# Patient Record
Sex: Male | Born: 1985 | Race: White | Hispanic: No | Marital: Single | Smoking: Current every day smoker
Health system: Southern US, Community
[De-identification: ages and names within clinical notes are randomized; demographics above are authoritative.]

## PROBLEM LIST (undated history)

## (undated) DIAGNOSIS — F952 Tourette's disorder: Secondary | ICD-10-CM

## (undated) DIAGNOSIS — F419 Anxiety disorder, unspecified: Secondary | ICD-10-CM

## (undated) DIAGNOSIS — F1020 Alcohol dependence, uncomplicated: Secondary | ICD-10-CM

## (undated) DIAGNOSIS — R109 Unspecified abdominal pain: Secondary | ICD-10-CM

## (undated) DIAGNOSIS — B1920 Unspecified viral hepatitis C without hepatic coma: Secondary | ICD-10-CM

## (undated) DIAGNOSIS — G8929 Other chronic pain: Secondary | ICD-10-CM

## (undated) DIAGNOSIS — F32A Depression, unspecified: Secondary | ICD-10-CM

## (undated) DIAGNOSIS — F329 Major depressive disorder, single episode, unspecified: Secondary | ICD-10-CM

## (undated) DIAGNOSIS — F1120 Opioid dependence, uncomplicated: Secondary | ICD-10-CM

---
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
## 2010-05-09 ENCOUNTER — Emergency Department (INDEPENDENT_AMBULATORY_CARE_PROVIDER_SITE_OTHER): Payer: Self-pay

## 2010-05-09 ENCOUNTER — Emergency Department (HOSPITAL_BASED_OUTPATIENT_CLINIC_OR_DEPARTMENT_OTHER)
Admission: EM | Admit: 2010-05-09 | Discharge: 2010-05-09 | Disposition: A | Discharge status: Home / Self Care | Payer: Self-pay | Attending: Emergency Medicine | Admitting: Emergency Medicine

## 2010-05-09 DIAGNOSIS — F17200 Nicotine dependence, unspecified, uncomplicated: Secondary | ICD-10-CM | POA: Insufficient documentation

## 2010-05-09 DIAGNOSIS — R1013 Epigastric pain: Secondary | ICD-10-CM | POA: Insufficient documentation

## 2010-05-09 DIAGNOSIS — R112 Nausea with vomiting, unspecified: Secondary | ICD-10-CM

## 2010-05-09 LAB — DIFFERENTIAL
Basophils Absolute: 0 10*3/uL (ref 0.0–0.1)
Basophils Relative: 0 % (ref 0–1)
Monocytes Relative: 6 % (ref 3–12)
Neutro Abs: 9.3 10*3/uL — ABNORMAL HIGH (ref 1.7–7.7)
Neutrophils Relative %: 86 % — ABNORMAL HIGH (ref 43–77)

## 2010-05-09 LAB — COMPREHENSIVE METABOLIC PANEL
ALT: 35 U/L (ref 0–53)
AST: 24 U/L (ref 0–37)
CO2: 25 mEq/L (ref 19–32)
Chloride: 104 mEq/L (ref 96–112)
GFR calc Af Amer: >60 mL/min (ref >60)
GFR calc non Af Amer: >60 mL/min (ref >60)
Sodium: 144 mEq/L (ref 135–145)
Total Bilirubin: 0.6 mg/dL (ref 0.3–1.2)

## 2010-05-09 LAB — CBC
Hemoglobin: 15 g/dL (ref 13.0–17.0)
RBC: 5.01 MIL/uL (ref 4.22–5.81)

## 2010-11-25 ENCOUNTER — Encounter: Payer: Self-pay | Admitting: Emergency Medicine

## 2010-11-25 ENCOUNTER — Emergency Department (HOSPITAL_BASED_OUTPATIENT_CLINIC_OR_DEPARTMENT_OTHER)
Admission: EM | Admit: 2010-11-25 | Discharge: 2010-11-25 | Disposition: A | Discharge status: Home / Self Care | Payer: Medicaid Other | Attending: Emergency Medicine | Admitting: Emergency Medicine

## 2010-11-25 DIAGNOSIS — K089 Disorder of teeth and supporting structures, unspecified: Secondary | ICD-10-CM | POA: Insufficient documentation

## 2010-11-25 DIAGNOSIS — K0889 Other specified disorders of teeth and supporting structures: Secondary | ICD-10-CM

## 2010-11-25 DIAGNOSIS — R220 Localized swelling, mass and lump, head: Secondary | ICD-10-CM | POA: Insufficient documentation

## 2010-11-25 DIAGNOSIS — R221 Localized swelling, mass and lump, neck: Secondary | ICD-10-CM | POA: Insufficient documentation

## 2010-11-25 HISTORY — DX: Depression, unspecified: F32.A

## 2010-11-25 HISTORY — DX: Major depressive disorder, single episode, unspecified: F32.9

## 2010-11-25 MED ORDER — PENICILLIN V POTASSIUM 500 MG PO TABS: 500.0000 mg | ORAL_TABLET | Freq: Four times a day (QID) | ORAL | Status: AC

## 2010-11-25 MED ORDER — OXYCODONE-ACETAMINOPHEN 5-325 MG PO TABS: 1.0000 | ORAL_TABLET | ORAL | Status: AC | PRN

## 2010-11-25 NOTE — ED Provider Notes (Signed)
History     CSN: [id number] Arrival date & time: 11/25/2010 11:43 AM   First MD Initiated Contact with Patient 11/25/10 1138      Chief Complaint  Patient presents with  . Dental Pain    (Consider location/radiation/quality/duration/timing/severity/associated sxs/prior treatment) HPI Comments: Patient is a 25 year old man who is having pain in the right side of his face. He says the molar on the right lower back of his jaw broken about 6 months ago now for the past 4 days it has become very painful and swollen. He also notes a swollen area in the right cheek. He notes associated swelling and pain behind the jaw and below his right ear, and the right side of his neck. He thinks he may have been febrile at night. He has taken Tylenol for pain, without much relief.  Patient is a 25 y.o. male presenting with tooth pain. The history is provided by the patient. No language interpreter was used.  Dental PainThe primary symptoms include mouth pain and fever. The symptoms began 5 to 7 days ago. The symptoms are worsening.  Additional symptoms include: gum swelling, gum tenderness, jaw pain and facial swelling.    Past Medical History  Diagnosis Date  . Depression     History reviewed. No pertinent past surgical history.  No family history on file.  History  Substance Use Topics  . Smoking status: Current Everyday Smoker  . Smokeless tobacco: Not on file  . Alcohol Use: Yes      Review of Systems  Constitutional: Positive for fever.  HENT: Positive for facial swelling, neck pain and dental problem.   Eyes: Negative.   Respiratory: Negative.   Cardiovascular: Negative.   Gastrointestinal: Negative.   Genitourinary: Negative.   Skin: Negative.   Neurological: Negative.   Psychiatric/Behavioral: Negative.     Allergies  Review of patient's allergies indicates no known allergies.  Home Medications   Current Outpatient Rx  Name Route Sig Dispense Refill  . ACETAMINOPHEN  325 MG PO TABS Oral Take 650 mg by mouth every 6 (six) hours as needed.      . OXYCODONE-ACETAMINOPHEN 5-325 MG PO TABS Oral Take 1 tablet by mouth every 4 (four) hours as needed for pain. 20 tablet 0  . PENICILLIN V POTASSIUM 500 MG PO TABS Oral Take 1 tablet (500 mg total) by mouth 4 (four) times daily. 20 tablet 0    BP 122/80  Pulse 89  Temp(Src) 98.3 F (36.8 C) (Oral)  Resp 16  Ht 5\' 8"  (1.727 m)  Wt 150 lb (68.04 kg)  BMI 22.81 kg/m2  SpO2 100%  Physical Exam  Constitutional: He is oriented to person, place, and time. He appears well-developed and well-nourished. Distressed: he is in moderate distress with pain in the right lower jaw.  HENT:  Head: Normocephalic and atraumatic.  Right Ear: External ear normal.  Left Ear: External ear normal.  Mouth/Throat: Oropharyngeal exudate: he has a broken off right lower third molar, with surrounding gingival swelling. There is a soft tissue mass in the right cheek about 1 cm in diameter which is not red or swollen or tender. He has right anterior cervical lymphadenopathy.  Eyes: Conjunctivae and EOM are normal. Pupils are equal, round, and reactive to light.  Neck: Normal range of motion. Neck supple.  Cardiovascular: Normal rate and regular rhythm.   Pulmonary/Chest: Effort normal and breath sounds normal.  Abdominal: Soft. Bowel sounds are normal.  Musculoskeletal: Normal range of motion.  Lymphadenopathy:  He has cervical adenopathy.  Neurological: He is alert and oriented to person, place, and time. He has normal reflexes.  Skin: Skin is warm and dry.  Psychiatric: He has a normal mood and affect. His behavior is normal.    ED Course  Procedures (including critical care time)  12:13 PM Patient was seen and had physical examination. He was prescribed Pen-Vee K 500 mg 4 times a day for infection, and Percocet every 4 hours as needed for pain. He was referred to Dr. Barbette Merino, oral surgeon on call.    1. Toothache            Carleene Cooper III, MD 11/25/10 1213

## 2010-11-25 NOTE — ED Notes (Signed)
Rt side of face hurting x 3 days jaw ,  Pain rads to neck and eye rt side has a lump on the rt side of mouth inside

## 2010-11-29 ENCOUNTER — Emergency Department (HOSPITAL_BASED_OUTPATIENT_CLINIC_OR_DEPARTMENT_OTHER)
Admission: EM | Admit: 2010-11-29 | Discharge: 2010-11-29 | Disposition: A | Discharge status: Home / Self Care | Payer: Self-pay | Attending: Emergency Medicine | Admitting: Emergency Medicine

## 2010-11-29 ENCOUNTER — Encounter (HOSPITAL_BASED_OUTPATIENT_CLINIC_OR_DEPARTMENT_OTHER): Payer: Self-pay | Admitting: *Deleted

## 2010-11-29 DIAGNOSIS — F17200 Nicotine dependence, unspecified, uncomplicated: Secondary | ICD-10-CM | POA: Insufficient documentation

## 2010-11-29 DIAGNOSIS — F329 Major depressive disorder, single episode, unspecified: Secondary | ICD-10-CM | POA: Insufficient documentation

## 2010-11-29 DIAGNOSIS — K089 Disorder of teeth and supporting structures, unspecified: Secondary | ICD-10-CM | POA: Insufficient documentation

## 2010-11-29 DIAGNOSIS — K0889 Other specified disorders of teeth and supporting structures: Secondary | ICD-10-CM

## 2010-11-29 DIAGNOSIS — F3289 Other specified depressive episodes: Secondary | ICD-10-CM | POA: Insufficient documentation

## 2010-11-29 MED ORDER — OXYCODONE-ACETAMINOPHEN 5-325 MG PO TABS: 1.0000 | ORAL_TABLET | Freq: Four times a day (QID) | ORAL | Status: AC | PRN

## 2010-11-29 NOTE — ED Notes (Signed)
Pt was seen for same 5 days ago, given Antibiotics and pain medication and instructed to follow up with dentist. Pt reports that he is out of his medication for pain and can not be seen till tomorrow by dentist.

## 2010-11-29 NOTE — ED Provider Notes (Signed)
History     CSN: [id number] Arrival date & time: 11/29/2010 11:41 AM   First MD Initiated Contact with Patient 11/29/10 1203      Chief Complaint  Patient presents with  . Dental Pain    (Consider location/radiation/quality/duration/timing/severity/associated sxs/prior treatment) HPI Patient presents with pain in his right lower molar. He was seen in the emergency department 5 days ago and started on penicillin as well as hydrocodone. He states that he's been taking his medications which have not helped very much but has also run out of the hydrocodone. He has not been taking any ibuprofen. He has no fevers no vomiting or other systemic symptoms. He reported to the nurse that he had an appointment tomorrow with the dentist however he reported to me that he has not called the dentist for any appointment. There no other associated symptoms. 2 wheezing and cold fluids make the pain worse and nothing makes the pain better.  Past Medical History  Diagnosis Date  . Depression     History reviewed. No pertinent past surgical history.  History reviewed. No pertinent family history.  History  Substance Use Topics  . Smoking status: Current Everyday Smoker -- 1.0 packs/day  . Smokeless tobacco: Not on file  . Alcohol Use: Yes      Review of Systems ROS reviewed and otherwise negative except for mentioned in HPI  Allergies  Review of patient's allergies indicates no known allergies.  Home Medications   Current Outpatient Rx  Name Route Sig Dispense Refill  . ACETAMINOPHEN 325 MG PO TABS Oral Take 650 mg by mouth every 6 (six) hours as needed.      . OXYCODONE-ACETAMINOPHEN 5-325 MG PO TABS Oral Take 1 tablet by mouth every 4 (four) hours as needed for pain. 20 tablet 0  . OXYCODONE-ACETAMINOPHEN 5-325 MG PO TABS Oral Take 1-2 tablets by mouth every 6 (six) hours as needed for pain. 15 tablet 0  . PENICILLIN V POTASSIUM 500 MG PO TABS Oral Take 1 tablet (500 mg total) by mouth 4  (four) times daily. 20 tablet 0    BP 122/72  Pulse 68  Temp(Src) 98.4 F (36.9 C) (Oral)  Resp 16  SpO2 99% Vitals reviewed Physical Exam Physical Examination: General appearance - alert, well appearing, and in no distress Mental status - alert, oriented to person, place, and time Mouth - mucous membranes moist, pharynx normal without lesions, right lower premolar with area of erosion, posterior molar with dental carie present, no periapical abscess, no trismus or sublingular swelling Neck - supple, no significant adenopathy Skin - normal coloration and turgor, no rashes, no suspicious skin lesions noted  ED Course  Procedures (including critical care time)  Labs Reviewed - No data to display No results found.   1. Pain, dental       MDM  Pt presents with continued dental pain- has been taking antibitoics and has run out of pain medication.  Pt initially stated to nurse that he had a dental appointment, then told me he has not contacted any dentist.  Strongly encouraged him to arrange follow up with a dentist for definitive care of his dental pain.  No abscess present at this time.  Pt nontoxic and well hydrated appearing.         Ethelda Chick, MD 11/29/10 [phone]

## 2011-09-10 ENCOUNTER — Encounter (HOSPITAL_BASED_OUTPATIENT_CLINIC_OR_DEPARTMENT_OTHER): Payer: Self-pay | Admitting: *Deleted

## 2011-09-10 ENCOUNTER — Emergency Department (HOSPITAL_BASED_OUTPATIENT_CLINIC_OR_DEPARTMENT_OTHER)
Admission: EM | Admit: 2011-09-10 | Discharge: 2011-09-10 | Disposition: A | Discharge status: Home / Self Care | Payer: Self-pay | Attending: Emergency Medicine | Admitting: Emergency Medicine

## 2011-09-10 DIAGNOSIS — Z0389 Encounter for observation for other suspected diseases and conditions ruled out: Secondary | ICD-10-CM | POA: Insufficient documentation

## 2011-09-10 NOTE — ED Notes (Signed)
Called into pt's room- states he has not been seen by provider but has to leave b/c he has to go to work-pt NAD-advised of risks involved-signed AMA-advised to return prn-states he will come back "in a couple of days"

## 2011-09-10 NOTE — ED Notes (Signed)
Pt c/o abscess to face x 2 years , increased and size and pain x 2 weeks

## 2011-09-11 ENCOUNTER — Emergency Department (HOSPITAL_BASED_OUTPATIENT_CLINIC_OR_DEPARTMENT_OTHER)
Admission: EM | Admit: 2011-09-11 | Discharge: 2011-09-11 | Disposition: A | Discharge status: Home / Self Care | Payer: Self-pay | Attending: Emergency Medicine | Admitting: Emergency Medicine

## 2011-09-11 ENCOUNTER — Encounter (HOSPITAL_BASED_OUTPATIENT_CLINIC_OR_DEPARTMENT_OTHER): Payer: Self-pay | Admitting: Emergency Medicine

## 2011-09-11 DIAGNOSIS — L0201 Cutaneous abscess of face: Secondary | ICD-10-CM | POA: Insufficient documentation

## 2011-09-11 DIAGNOSIS — L03211 Cellulitis of face: Secondary | ICD-10-CM | POA: Insufficient documentation

## 2011-09-11 DIAGNOSIS — F329 Major depressive disorder, single episode, unspecified: Secondary | ICD-10-CM | POA: Insufficient documentation

## 2011-09-11 DIAGNOSIS — F17200 Nicotine dependence, unspecified, uncomplicated: Secondary | ICD-10-CM | POA: Insufficient documentation

## 2011-09-11 DIAGNOSIS — F3289 Other specified depressive episodes: Secondary | ICD-10-CM | POA: Insufficient documentation

## 2011-09-11 MED ORDER — SULFAMETHOXAZOLE-TRIMETHOPRIM 800-160 MG PO TABS: 2.0000 | ORAL_TABLET | Freq: Two times a day (BID) | ORAL | Status: AC

## 2011-09-11 MED ORDER — IBUPROFEN 800 MG PO TABS: 800.0000 mg | ORAL_TABLET | Freq: Three times a day (TID) | ORAL | Status: AC | PRN

## 2011-09-11 NOTE — ED Notes (Signed)
Pt states he believes he has infection on inside of right cheek.  No known fever.  Pt has swelling noted to right side of cheek.

## 2011-09-11 NOTE — ED Provider Notes (Signed)
History     CSN: [id number]  Arrival date & time 09/11/11  1232   First MD Initiated Contact with Patient 09/11/11 1254      Chief Complaint  Patient presents with  . Oral Swelling    (Consider location/radiation/quality/duration/timing/severity/associated sxs/prior treatment) HPI Comments: Patient reports he has had a small nodule within his right cheek for approximately 2 years.  States that a few days ago he thinks it broke open inside and began to get inflamed.  States it is now 3 times the size it usually is and is painful.  Pain is throbbing, 8/10 currently, with occasional sharp pains through it.  Denies fevers, dental pain/caries, sore throat, difficulty swallowing or breathing.    The history is provided by the patient.    Past Medical History  Diagnosis Date  . Depression     No past surgical history on file.  No family history on file.  History  Substance Use Topics  . Smoking status: Current Everyday Smoker -- 1.0 packs/day  . Smokeless tobacco: Not on file  . Alcohol Use: No      Review of Systems  Constitutional: Negative for fever and chills.  HENT: Negative for sore throat, trouble swallowing and dental problem.   Respiratory: Negative for shortness of breath.   Skin: Negative for rash.    Allergies  Review of patient's allergies indicates no known allergies.  Home Medications  No current outpatient prescriptions on file.  BP 103/66  Pulse 66  Temp 98 F (36.7 C) (Oral)  Resp 16  SpO2 99%  Physical Exam  Nursing note and vitals reviewed. Constitutional: He appears well-developed and well-nourished. No distress.  HENT:  Head: Normocephalic and atraumatic.    Mouth/Throat: Uvula is midline, oropharynx is clear and moist and mucous membranes are normal. Normal dentition. No uvula swelling or dental caries.  Neck: Neck supple.  Pulmonary/Chest: Effort normal.  Neurological: He is alert.  Skin: No rash noted. He is not diaphoretic.     ED Course  Procedures (including critical care time)  Labs Reviewed - No data to display No results found.   1. Facial abscess       MDM  Afebrile nontoxic patient with small facial abscess vs early pustule.  Area is indurated, unlikely to benefit from I&D at this time.  Discussed diagnosis and care plan with patient.  Pt given return precautions.  Pt verbalizes understanding and agrees with plan.           [place], [place] 09/11/11 1318

## 2011-09-12 NOTE — ED Provider Notes (Signed)
Medical screening examination/treatment/procedure(s) were performed by non-physician practitioner and as supervising physician I was immediately available for consultation/collaboration.   Cyndra Numbers, MD 09/12/11 [phone]

## 2011-10-21 ENCOUNTER — Emergency Department (HOSPITAL_BASED_OUTPATIENT_CLINIC_OR_DEPARTMENT_OTHER)
Admission: EM | Admit: 2011-10-21 | Discharge: 2011-10-21 | Disposition: A | Discharge status: Home / Self Care | Payer: Self-pay | Attending: Emergency Medicine | Admitting: Emergency Medicine

## 2011-10-21 ENCOUNTER — Emergency Department (HOSPITAL_BASED_OUTPATIENT_CLINIC_OR_DEPARTMENT_OTHER): Payer: Self-pay

## 2011-10-21 ENCOUNTER — Encounter (HOSPITAL_BASED_OUTPATIENT_CLINIC_OR_DEPARTMENT_OTHER): Payer: Self-pay | Admitting: *Deleted

## 2011-10-21 DIAGNOSIS — R109 Unspecified abdominal pain: Secondary | ICD-10-CM

## 2011-10-21 DIAGNOSIS — G8929 Other chronic pain: Secondary | ICD-10-CM | POA: Insufficient documentation

## 2011-10-21 HISTORY — DX: Other chronic pain: G89.29

## 2011-10-21 HISTORY — DX: Unspecified abdominal pain: R10.9

## 2011-10-21 LAB — CBC WITH DIFFERENTIAL/PLATELET
Basophils Absolute: 0 K/uL (ref 0.0–0.1)
Basophils Relative: 1 % (ref 0–1)
Eosinophils Absolute: 0.2 10*3/uL (ref 0.0–0.7)
Eosinophils Relative: 3 % (ref 0–5)
HCT: 41.5 % (ref 39.0–52.0)
Hemoglobin: 14.5 g/dL (ref 13.0–17.0)
Lymphocytes Relative: 23 % (ref 12–46)
Lymphs Abs: 1.4 10*3/uL (ref 0.7–4.0)
MCH: 30.6 pg (ref 26.0–34.0)
MCHC: 34.9 g/dL (ref 30.0–36.0)
MCV: 87.6 fL (ref 78.0–100.0)
Monocytes Absolute: 0.5 10*3/uL (ref 0.1–1.0)
Monocytes Relative: 9 % (ref 3–12)
Neutro Abs: 3.8 K/uL (ref 1.7–7.7)
Neutrophils Relative %: 65 % (ref 43–77)
Platelets: 267 10*3/uL (ref 150–400)
RBC: 4.74 MIL/uL (ref 4.22–5.81)
RDW: 12.8 % (ref 11.5–15.5)
WBC: 5.9 K/uL (ref 4.0–10.5)

## 2011-10-21 LAB — COMPREHENSIVE METABOLIC PANEL WITH GFR
AST: 14 U/L (ref 0–37)
Albumin: 4.1 g/dL (ref 3.5–5.2)
Alkaline Phosphatase: 61 U/L (ref 39–117)
CO2: 28 meq/L (ref 19–32)
Chloride: 103 meq/L (ref 96–112)
Potassium: 3.9 meq/L (ref 3.5–5.1)
Total Bilirubin: 0.2 mg/dL — ABNORMAL LOW (ref 0.3–1.2)

## 2011-10-21 LAB — URINALYSIS, ROUTINE W REFLEX MICROSCOPIC
Bilirubin Urine: NEGATIVE
Glucose, UA: NEGATIVE mg/dL
Hgb urine dipstick: NEGATIVE
Ketones, ur: NEGATIVE mg/dL
Leukocytes, UA: NEGATIVE
Nitrite: NEGATIVE
Protein, ur: NEGATIVE mg/dL
Specific Gravity, Urine: 1.019 (ref 1.005–1.030)
Urobilinogen, UA: 0.2 mg/dL (ref 0.0–1.0)
pH: 7 (ref 5.0–8.0)

## 2011-10-21 LAB — LIPASE, BLOOD: Lipase: 22 U/L (ref 11–59)

## 2011-10-21 LAB — COMPREHENSIVE METABOLIC PANEL
ALT: 13 U/L (ref 0–53)
BUN: 7 mg/dL (ref 6–23)
Calcium: 9.3 mg/dL (ref 8.4–10.5)
Creatinine, Ser: 0.9 mg/dL (ref 0.50–1.35)
GFR calc Af Amer: >90 mL/min (ref >90)
GFR calc non Af Amer: >90 mL/min (ref >90)
Glucose, Bld: 91 mg/dL (ref 70–99)
Sodium: 138 mEq/L (ref 135–145)
Total Protein: 7.1 g/dL (ref 6.0–8.3)

## 2011-10-21 LAB — OCCULT BLOOD X 1 CARD TO LAB, STOOL: Fecal Occult Bld: NEGATIVE

## 2011-10-21 MED ORDER — PROMETHAZINE HCL 25 MG PO TABS: 25.0000 mg | ORAL_TABLET | Freq: Four times a day (QID) | ORAL | Status: DC | PRN

## 2011-10-21 MED ORDER — PANTOPRAZOLE SODIUM 40 MG IV SOLR
40.0000 mg | Freq: Once | INTRAVENOUS | Status: AC
  Administered 2011-10-21: 40 mg via INTRAVENOUS
  Filled 2011-10-21: qty 40

## 2011-10-21 MED ORDER — SODIUM CHLORIDE 0.9 % IV BOLUS (SEPSIS)
500.0000 mL | Freq: Once | INTRAVENOUS | Status: AC
  Administered 2011-10-21: 500 mL via INTRAVENOUS

## 2011-10-21 MED ORDER — MORPHINE SULFATE 4 MG/ML IJ SOLN
4.0000 mg | Freq: Once | INTRAMUSCULAR | Status: AC
  Administered 2011-10-21: 4 mg via INTRAVENOUS
  Filled 2011-10-21: qty 1

## 2011-10-21 MED ORDER — FENTANYL CITRATE 0.05 MG/ML IJ SOLN
100.0000 ug | Freq: Once | INTRAMUSCULAR | Status: AC
  Administered 2011-10-21: 100 ug via INTRAVENOUS
  Filled 2011-10-21: qty 2

## 2011-10-21 MED ORDER — ONDANSETRON 8 MG PO TBDP: 8.0000 mg | ORAL_TABLET | Freq: Two times a day (BID) | ORAL | Status: DC | PRN

## 2011-10-21 MED ORDER — HYDROCODONE-ACETAMINOPHEN 5-325 MG PO TABS: ORAL_TABLET | ORAL | Status: DC

## 2011-10-21 MED ORDER — PANTOPRAZOLE SODIUM 40 MG PO TBEC: 40.0000 mg | DELAYED_RELEASE_TABLET | Freq: Every day | ORAL | Status: DC

## 2011-10-21 MED ORDER — ONDANSETRON HCL 4 MG/2ML IJ SOLN
4.0000 mg | Freq: Once | INTRAMUSCULAR | Status: AC
  Administered 2011-10-21: 4 mg via INTRAVENOUS
  Filled 2011-10-21: qty 2

## 2011-10-21 NOTE — Discharge Instructions (Signed)
Narcotic and benzodiazepine use may cause drowsiness, slowed breathing or dependence.  Please use with caution and do not drive, operate machinery or watch young children alone while taking them.  Taking combinations of these medications or drinking alcohol will potentiate these effects.    

## 2011-10-21 NOTE — ED Notes (Signed)
Pt c/o diffuse abd pain nausea only x 1 day 

## 2011-10-21 NOTE — ED Provider Notes (Signed)
History     CSN: [id number]  Arrival date & time 10/21/11  1511   First MD Initiated Contact with Patient 10/21/11 1606      Chief Complaint  Patient presents with  . Abdominal Pain    (Consider location/radiation/quality/duration/timing/severity/associated sxs/prior treatment) HPI Comments: Pt reports had first episode of severe abd pain 5 years ago, seen in ED, had CT scan, no appendicitis.  Father has UC, pt has never seen GI or had colonoscopy due to lack of insurance, no PCP.  He reports daily abd pain, mild, doesn't take any meds for it.  Has had severe severe episodes, last one 4 months ago until this AM.  No fevers, no foreign travel.  No obv sick contacts.  Pt reports about 15-20 lbs weight loss in past 6 months, pants don't fit him.  No prior surgeries.  Pt has sig h/o depression, smokes, drinks occasionally.    Patient is a 26 y.o. male presenting with abdominal pain. The history is provided by the patient and a relative.  Abdominal Pain The primary symptoms of the illness include abdominal pain and diarrhea. The primary symptoms of the illness do not include shortness of breath, nausea or vomiting. The current episode started 6 to 12 hours ago. The onset of the illness was gradual. The problem has been gradually worsening.  Additional symptoms associated with the illness include anorexia. Symptoms associated with the illness do not include chills, diaphoresis, heartburn, constipation, urgency, hematuria, frequency or back pain.    Past Medical History  Diagnosis Date  . Depression   . Chronic abdominal pain     History reviewed. No pertinent past surgical history.  Family History  Problem Relation Age of Onset  . Ulcerative colitis Father     History  Substance Use Topics  . Smoking status: Current Every Day Smoker -- 1.0 packs/day  . Smokeless tobacco: Not on file  . Alcohol Use: No      Review of Systems  Constitutional: Positive for appetite change and  unexpected weight change. Negative for chills and diaphoresis.  Respiratory: Negative for shortness of breath.   Cardiovascular: Negative for chest pain.  Gastrointestinal: Positive for abdominal pain, diarrhea and anorexia. Negative for heartburn, nausea, vomiting, constipation and rectal pain.  Genitourinary: Negative for urgency, frequency and hematuria.  Musculoskeletal: Negative for back pain.  Skin: Negative for rash.  All other systems reviewed and are negative.    Allergies  Review of patient's allergies indicates no known allergies.  Home Medications   Current Outpatient Rx  Name Route Sig Dispense Refill  . HYDROCODONE-ACETAMINOPHEN 5-325 MG PO TABS  1-2 tablets po q 6 hours prn moderate to severe pain 20 tablet 0  . ONDANSETRON 8 MG PO TBDP Oral Take 1 tablet (8 mg total) by mouth every 12 (twelve) hours as needed for nausea. 20 tablet 0  . PANTOPRAZOLE SODIUM 40 MG PO TBEC Oral Take 1 tablet (40 mg total) by mouth daily. 14 tablet 0  . PROMETHAZINE HCL 25 MG PO TABS Oral Take 1 tablet (25 mg total) by mouth every 6 (six) hours as needed for nausea. 20 tablet 0    BP 117/69  Pulse 86  Temp 98.2 F (36.8 C) (Oral)  Resp 16  Ht 5\' 9"  (1.753 m)  Wt 150 lb (68.04 kg)  BMI 22.15 kg/m2  SpO2 99%  Physical Exam  Nursing note and vitals reviewed. Constitutional: He appears well-developed and well-nourished. He appears distressed.  Eyes: Pupils are equal, round,  and reactive to light. No scleral icterus.  Neck: Normal range of motion. Neck supple.  Cardiovascular: Normal rate and regular rhythm.   Pulmonary/Chest: Effort normal. No respiratory distress. He has no rales.  Abdominal: Soft. He exhibits no distension. There is tenderness. There is guarding. There is no rebound.  Genitourinary: Rectal exam shows no external hemorrhoid, no tenderness and anal tone normal. Prostate is not tender.       Chaperone present  Neurological: He is alert.  Skin: Skin is warm and dry.  No rash noted. He is not diaphoretic.    ED Course  Procedures (including critical care time)  Labs Reviewed  COMPREHENSIVE METABOLIC PANEL - Abnormal; Notable for the following:    Total Bilirubin 0.2 (*)     All other components within normal limits  URINALYSIS, ROUTINE W REFLEX MICROSCOPIC  CBC WITH DIFFERENTIAL  LIPASE, BLOOD  OCCULT BLOOD X 1 CARD TO LAB, STOOL   US Abdomen Complete  10/21/2011  *RADIOLOGY REPORT*  Clinical Data:  Abdominal pain.  COMPLETE ABDOMINAL ULTRASOUND  Comparison:  Acute abdominal series 05/09/2010.  Findings:  Gallbladder:  No shadowing gallstones or echogenic sludge.  No gallbladder wall thickening or pericholecystic fluid.  No sonographic Murphy's sign according to the ultrasound technologist. Wall thickness is 1.9 mm, within normal limits.  The gallbladder is incompletely distended.  Common bile duct:  Normal in caliber. No biliary ductal dilation. Maximal diameter is 1.3 mm, within normal limits.  Liver:  No focal lesion identified.  Within normal limits in parenchymal echogenicity.  IVC:  Appears normal.  Pancreas:  Although the pancreas is difficult to visualize in its entirety, no focal pancreatic abnormality is identified.  Spleen:  Normal size and echotexture without focal parenchymal abnormality.  The maximal length is 7.0 cm  Right Kidney:  No hydronephrosis.  Well-preserved cortex.  Normal size and parenchymal echotexture without focal abnormalities. The maximal length is 11.2 cm, within normal limits.  Left Kidney:  No hydronephrosis.  Well-preserved cortex.  Normal size and parenchymal echotexture without focal abnormalities. The maximal length is 11.6 cm, within normal limits.  Abdominal aorta:  Normal.  Maximal diameter is 1.6 cm.  IMPRESSION: Negative abdominal ultrasound.   Original Report Authenticated By: Jamesetta Orleans. MATTERN, M.D.      1. Abdominal pain     RA sat is 99% and I interpret to be normal   6:21 PM Labs, hemoccult, U/S are  all negative.  No fever, stable vitals.  Will d./c home with Rx for nausea and pain and protonix.     6:32 PM abd soft.  Pt feels mildly improved.  Abd remains soft, no rebound.  Will give additional dose of analgesics, if can tolerate PO's, will d.c home with Rx's for nausea, pain, PPI.  MDM  Pt with several episodes of similar.  Tender, guarding, no rebound.  Will give IV analgesics.  Test hemoccult for blood.  U/S since upper epigastric pain and tenderness.     Per prior EPIC notes, pt has been seen for abscess and dental pain a few times in the past, seen for similar epigastric pain on 05/2010, had plain films that were neg, given oral abx for diverticulitis.  Pt's symptoms improved with ED treatment at that time.      Gavin Pound. Oletta Lamas, MD 10/21/11 [id number]

## 2011-10-21 NOTE — ED Notes (Signed)
Patient was given ginger ale PO.

## 2011-10-25 ENCOUNTER — Encounter (HOSPITAL_BASED_OUTPATIENT_CLINIC_OR_DEPARTMENT_OTHER): Payer: Self-pay | Admitting: *Deleted

## 2011-10-25 ENCOUNTER — Emergency Department (HOSPITAL_BASED_OUTPATIENT_CLINIC_OR_DEPARTMENT_OTHER): Payer: Self-pay

## 2011-10-25 ENCOUNTER — Emergency Department (HOSPITAL_BASED_OUTPATIENT_CLINIC_OR_DEPARTMENT_OTHER)
Admission: EM | Admit: 2011-10-25 | Discharge: 2011-10-26 | Disposition: A | Discharge status: Home / Self Care | Payer: Self-pay | Attending: Emergency Medicine | Admitting: Emergency Medicine

## 2011-10-25 DIAGNOSIS — R112 Nausea with vomiting, unspecified: Secondary | ICD-10-CM | POA: Insufficient documentation

## 2011-10-25 DIAGNOSIS — R109 Unspecified abdominal pain: Secondary | ICD-10-CM | POA: Insufficient documentation

## 2011-10-25 DIAGNOSIS — F17200 Nicotine dependence, unspecified, uncomplicated: Secondary | ICD-10-CM | POA: Insufficient documentation

## 2011-10-25 DIAGNOSIS — R197 Diarrhea, unspecified: Secondary | ICD-10-CM | POA: Insufficient documentation

## 2011-10-25 DIAGNOSIS — N200 Calculus of kidney: Secondary | ICD-10-CM | POA: Insufficient documentation

## 2011-10-25 LAB — CBC WITH DIFFERENTIAL/PLATELET
Basophils Absolute: 0 10*3/uL (ref 0.0–0.1)
Basophils Relative: 0 % (ref 0–1)
Eosinophils Relative: 1 % (ref 0–5)
Lymphocytes Relative: 16 % (ref 12–46)
MCHC: 35 g/dL (ref 30.0–36.0)
Neutro Abs: 9.8 10*3/uL — ABNORMAL HIGH (ref 1.7–7.7)
Platelets: 284 10*3/uL (ref 150–400)
RDW: 13 % (ref 11.5–15.5)
WBC: 13.1 10*3/uL — ABNORMAL HIGH (ref 4.0–10.5)

## 2011-10-25 LAB — COMPREHENSIVE METABOLIC PANEL
ALT: 16 U/L (ref 0–53)
AST: 18 U/L (ref 0–37)
Albumin: 4.4 g/dL (ref 3.5–5.2)
CO2: 30 mEq/L (ref 19–32)
Calcium: 9.8 mg/dL (ref 8.4–10.5)
Chloride: 102 mEq/L (ref 96–112)
Creatinine, Ser: 0.9 mg/dL (ref 0.50–1.35)
GFR calc non Af Amer: >90 mL/min (ref >90)
Sodium: 141 mEq/L (ref 135–145)

## 2011-10-25 LAB — URINALYSIS, ROUTINE W REFLEX MICROSCOPIC
Ketones, ur: NEGATIVE mg/dL
Leukocytes, UA: NEGATIVE
Nitrite: NEGATIVE
Protein, ur: NEGATIVE mg/dL
Urobilinogen, UA: 0.2 mg/dL (ref 0.0–1.0)

## 2011-10-25 MED ORDER — IOHEXOL 300 MG/ML  SOLN
20.0000 mL | Freq: Once | INTRAMUSCULAR | Status: AC | PRN
  Administered 2011-10-26: 20 mL via ORAL

## 2011-10-25 MED ORDER — IOHEXOL 300 MG/ML  SOLN
100.0000 mL | Freq: Once | INTRAMUSCULAR | Status: AC | PRN
  Administered 2011-10-26: 100 mL via INTRAVENOUS

## 2011-10-25 MED ORDER — HYDROMORPHONE HCL PF 1 MG/ML IJ SOLN
1.0000 mg | Freq: Once | INTRAMUSCULAR | Status: AC
  Administered 2011-10-25: 1 mg via INTRAVENOUS
  Filled 2011-10-25: qty 1

## 2011-10-25 NOTE — ED Notes (Signed)
Pt informed nurse he will need @mg  of dilaudid not one , MD made aware

## 2011-10-25 NOTE — ED Provider Notes (Signed)
History   This chart was scribed for [name], MD by Sofie Rower. The patient was seen in room MH11/MH11 and the patient's care was started at 10:45PM    CSN: [id number]  Arrival date & time 10/25/11  2235   First MD Initiated Contact with Patient 10/25/11 2245      Chief Complaint  Patient presents with  . Abdominal Pain    (Consider location/radiation/quality/duration/timing/severity/associated sxs/prior treatment) Patient is a 26 y.o. male presenting with abdominal pain. The history is provided by the patient. No language interpreter was used.  Abdominal Pain The primary symptoms of the illness include abdominal pain. The primary symptoms of the illness do not include fever, nausea, vomiting or diarrhea. The current episode started more than 2 days ago. The onset of the illness was gradual. The problem has been gradually worsening.  The abdominal pain began more than 2 days ago. The pain came on gradually. The abdominal pain has been gradually worsening since its onset. The abdominal pain is generalized. The abdominal pain does not radiate. The abdominal pain is relieved by nothing. The abdominal pain is exacerbated by bowel movements.  The patient states that she believes she is currently not pregnant. The patient has not had a change in bowel habit. Symptoms associated with the illness do not include constipation or back pain.    Jeff Browning is a 26 y.o. male , with a hx of chronic abdominal pain and depression, who presents to the Emergency Department complaining of  gradual, progressively worsening, abdominal pain, located diffusely across the abdomen, onset one week ago. The pt reports his abdominal pain is a swelling sensation. Modifying factors include experiencing bowel movements which intensifies the abdominal pain.  The pt is a current everyday smoker, however, he does not drink alcohol. The pt does not have insurance.    Past Medical History  Diagnosis Date   . Depression   . Chronic abdominal pain     History reviewed. No pertinent past surgical history.  Family History  Problem Relation Age of Onset  . Ulcerative colitis Father     History  Substance Use Topics  . Smoking status: Current Every Day Smoker -- 1.0 packs/day  . Smokeless tobacco: Not on file  . Alcohol Use: No      Review of Systems  Constitutional: Negative for fever.  Gastrointestinal: Positive for abdominal pain. Negative for nausea, vomiting, diarrhea and constipation.  Musculoskeletal: Negative for back pain.  All other systems reviewed and are negative.    Allergies  Review of patient's allergies indicates no known allergies.  Home Medications   Current Outpatient Rx  Name Route Sig Dispense Refill  . HYDROCODONE-ACETAMINOPHEN 5-325 MG PO TABS  1-2 tablets po q 6 hours prn moderate to severe pain 20 tablet 0  . ONDANSETRON 8 MG PO TBDP Oral Take 1 tablet (8 mg total) by mouth every 12 (twelve) hours as needed for nausea. 20 tablet 0  . PANTOPRAZOLE SODIUM 40 MG PO TBEC Oral Take 1 tablet (40 mg total) by mouth daily. 14 tablet 0  . PROMETHAZINE HCL 25 MG PO TABS Oral Take 1 tablet (25 mg total) by mouth every 6 (six) hours as needed for nausea. 20 tablet 0    BP 110/82  Pulse 103  Temp 97.9 F (36.6 C) (Oral)  Resp 20  Wt 155 lb (70.308 kg)  SpO2 100%  Physical Exam  Nursing note and vitals reviewed. Constitutional: He is oriented to person, place, and  time. He appears well-developed and well-nourished.  HENT:  Head: Atraumatic.  Right Ear: External ear normal.  Left Ear: External ear normal.  Nose: Nose normal.  Mouth/Throat: Oropharynx is clear and moist.  Eyes: Conjunctivae normal and EOM are normal. Pupils are equal, round, and reactive to light.  Neck: Normal range of motion.  Cardiovascular: Normal rate, regular rhythm and normal heart sounds.   Pulmonary/Chest: Effort normal and breath sounds normal. He has no wheezes.  Abdominal:  Soft. Bowel sounds are normal. There is no rebound and no guarding.  Musculoskeletal: Normal range of motion.  Neurological: He is alert and oriented to person, place, and time.  Skin: Skin is warm and dry.  Psychiatric: He has a normal mood and affect. His behavior is normal.    ED Course  Procedures (including critical care time)  DIAGNOSTIC STUDIES: Oxygen Saturation is 100% on room air, normal by my interpretation.    COORDINATION OF CARE:    11:00PM- Pain management, follow up with GI, and treatment plan discussed with patient. Pt agrees with treatment.   Results for orders placed during the hospital encounter of 10/25/11  URINALYSIS, ROUTINE W REFLEX MICROSCOPIC      Component Value Range   Color, Urine YELLOW  YELLOW   APPearance CLEAR  CLEAR   Specific Gravity, Urine 1.007  1.005 - 1.030   pH 7.0  5.0 - 8.0   Glucose, UA NEGATIVE  NEGATIVE mg/dL   Hgb urine dipstick NEGATIVE  NEGATIVE   Bilirubin Urine NEGATIVE  NEGATIVE   Ketones, ur NEGATIVE  NEGATIVE mg/dL   Protein, ur NEGATIVE  NEGATIVE mg/dL   Urobilinogen, UA 0.2  0.0 - 1.0 mg/dL   Nitrite NEGATIVE  NEGATIVE   Leukocytes, UA NEGATIVE  NEGATIVE  CBC WITH DIFFERENTIAL      Component Value Range   WBC 13.1 (*) 4.0 - 10.5 K/uL   RBC 4.63  4.22 - 5.81 MIL/uL   Hemoglobin 14.2  13.0 - 17.0 g/dL   HCT 09.8  11.9 - 14.7 %   MCV 87.7  78.0 - 100.0 fL   MCH 30.7  26.0 - 34.0 pg   MCHC 35.0  30.0 - 36.0 g/dL   RDW 82.9  56.2 - 13.0 %   Platelets 284  150 - 400 K/uL   Neutrophils Relative 75  43 - 77 %   Neutro Abs 9.8 (*) 1.7 - 7.7 K/uL   Lymphocytes Relative 16  12 - 46 %   Lymphs Abs 2.2  0.7 - 4.0 K/uL   Monocytes Relative 7  3 - 12 %   Monocytes Absolute 0.9  0.1 - 1.0 K/uL   Eosinophils Relative 1  0 - 5 %   Eosinophils Absolute 0.2  0.0 - 0.7 K/uL   Basophils Relative 0  0 - 1 %   Basophils Absolute 0.0  0.0 - 0.1 K/uL  COMPREHENSIVE METABOLIC PANEL      Component Value Range   Sodium 141  135 - 145  mEq/L   Potassium 4.9  3.5 - 5.1 mEq/L   Chloride 102  96 - 112 mEq/L   CO2 30  19 - 32 mEq/L   Glucose, Bld 86  70 - 99 mg/dL   BUN 8  6 - 23 mg/dL   Creatinine, Ser 8.65  0.50 - 1.35 mg/dL   Calcium 9.8  8.4 - 78.4 mg/dL   Total Protein 7.3  6.0 - 8.3 g/dL   Albumin 4.4  3.5 - 5.2  g/dL   AST 18  0 - 37 U/L   ALT 16  0 - 53 U/L   Alkaline Phosphatase 66  39 - 117 U/L   Total Bilirubin 0.2 (*) 0.3 - 1.2 mg/dL   GFR calc non Af Amer >90  >90 mL/min   GFR calc Af Amer >90  >90 mL/min  LIPASE, BLOOD      Component Value Range   Lipase 22  11 - 59 U/L     Ct Abdomen Pelvis W Contrast  10/26/2011  *RADIOLOGY REPORT*  Clinical Data: 26 year old male with abdominal pain nausea vomiting diarrhea.  CT ABDOMEN AND PELVIS WITH CONTRAST  Technique:  Multidetector CT imaging of the abdomen and pelvis was performed following the standard protocol during bolus administration of intravenous contrast.  Contrast:  OMNIPAQUE IOHEXOL 300 MG/ML  SOLN  Comparison: Abdominal ultrasound 10/21/2011, radiographic abdominal series 05/09/2010.  Findings: Negative lung bases. No acute osseous abnormality identified.  No pelvic free fluid.  Mildly distended but otherwise negative bladder.  Negative distal colon other than retained stool. Retained stool throughout the more proximal colon.  Normal retrocecal appendix.  Oral contrast has not yet reached the terminal ileum.  More proximal small bowel loops are within normal limits.  Stomach and duodenum are within normal limits.  Liver, gallbladder, spleen, pancreas, adrenal glands and portal venous system are within normal limits.  Major arterial structures are normal.  There is a nonobstructing 2-3 mm right lower pole renal calculus. Otherwise normal right kidney.  No definite left nephrolithiasis. No hydronephrosis, perinephric stranding, or hydroureter.  No abdominal free fluid.  No lymphadenopathy.  IMPRESSION: Normal appendix and negative CT of the abdomen pelvis  except for nonobstructing right nephrolithiasis.   Original Report Authenticated By: Harley Hallmark, M.D.       No diagnosis found.    MDM  CT negative.  Patient requesting increased doses of stronger pain medication for ongoing pain without source.  Advised patient who is angry that he is not getting narcotics to follow up with GI for further care.        I personally performed the services described in this documentation, which was scribed in my presence. The recorded information has been reviewed and considered.    Jasmine Awe, MD 10/26/11 [phone]

## 2011-10-25 NOTE — ED Notes (Signed)
Abdominal pain. Was seen last week for same.

## 2011-10-26 MED ORDER — TRAMADOL HCL 50 MG PO TABS: 50.0000 mg | ORAL_TABLET | Freq: Four times a day (QID) | ORAL | Status: DC | PRN

## 2011-10-26 MED ORDER — KETOROLAC TROMETHAMINE 30 MG/ML IJ SOLN
30.0000 mg | Freq: Once | INTRAMUSCULAR | Status: AC
  Administered 2011-10-26: 30 mg via INTRAVENOUS
  Filled 2011-10-26: qty 1

## 2011-10-26 NOTE — ED Notes (Signed)
Pt demanding script for pain meds other than ultram. When leaving the room pt threw papers at nurse and cursed, Press photographer and MD made aware

## 2012-02-03 ENCOUNTER — Emergency Department (HOSPITAL_BASED_OUTPATIENT_CLINIC_OR_DEPARTMENT_OTHER)
Admission: EM | Admit: 2012-02-03 | Discharge: 2012-02-03 | Disposition: A | Discharge status: Home / Self Care | Payer: Self-pay | Attending: Emergency Medicine | Admitting: Emergency Medicine

## 2012-02-03 ENCOUNTER — Encounter (HOSPITAL_BASED_OUTPATIENT_CLINIC_OR_DEPARTMENT_OTHER): Payer: Self-pay | Admitting: *Deleted

## 2012-02-03 DIAGNOSIS — R05 Cough: Secondary | ICD-10-CM | POA: Insufficient documentation

## 2012-02-03 DIAGNOSIS — J3489 Other specified disorders of nose and nasal sinuses: Secondary | ICD-10-CM | POA: Insufficient documentation

## 2012-02-03 DIAGNOSIS — J029 Acute pharyngitis, unspecified: Secondary | ICD-10-CM | POA: Insufficient documentation

## 2012-02-03 DIAGNOSIS — G8929 Other chronic pain: Secondary | ICD-10-CM | POA: Insufficient documentation

## 2012-02-03 DIAGNOSIS — R5381 Other malaise: Secondary | ICD-10-CM | POA: Insufficient documentation

## 2012-02-03 DIAGNOSIS — Z8659 Personal history of other mental and behavioral disorders: Secondary | ICD-10-CM | POA: Insufficient documentation

## 2012-02-03 DIAGNOSIS — R059 Cough, unspecified: Secondary | ICD-10-CM | POA: Insufficient documentation

## 2012-02-03 DIAGNOSIS — F17200 Nicotine dependence, unspecified, uncomplicated: Secondary | ICD-10-CM | POA: Insufficient documentation

## 2012-02-03 DIAGNOSIS — R5383 Other fatigue: Secondary | ICD-10-CM | POA: Insufficient documentation

## 2012-02-03 DIAGNOSIS — J111 Influenza due to unidentified influenza virus with other respiratory manifestations: Secondary | ICD-10-CM | POA: Insufficient documentation

## 2012-02-03 DIAGNOSIS — R109 Unspecified abdominal pain: Secondary | ICD-10-CM | POA: Insufficient documentation

## 2012-02-03 HISTORY — DX: Tourette's disorder: F95.2

## 2012-02-03 HISTORY — DX: Anxiety disorder, unspecified: F41.9

## 2012-02-03 LAB — RAPID STREP SCREEN (MED CTR MEBANE ONLY): Streptococcus, Group A Screen (Direct): NEGATIVE

## 2012-02-03 MED ORDER — OSELTAMIVIR PHOSPHATE 75 MG PO CAPS
75.0000 mg | ORAL_CAPSULE | Freq: Once | ORAL | Status: AC
  Administered 2012-02-03: 75 mg via ORAL
  Filled 2012-02-03: qty 1

## 2012-02-03 MED ORDER — OSELTAMIVIR PHOSPHATE 75 MG PO CAPS: 75.0000 mg | ORAL_CAPSULE | Freq: Two times a day (BID) | ORAL | Status: DC

## 2012-02-03 NOTE — ED Notes (Signed)
Patient states he has a 2 day hx of being tired and sluggish.  Woke at 0100 today with a fever of 101.  C/O severe sore throat, generalized body aches and weakness, non productive cough. Last dose of ibuprofen was 2 hours pta.

## 2012-02-03 NOTE — ED Provider Notes (Signed)
History     CSN: [id number]  Arrival date & time 02/03/12  1313   First MD Initiated Contact with Patient 02/03/12 1417      Chief Complaint  Patient presents with  . Influenza    (Consider location/radiation/quality/duration/timing/severity/associated sxs/prior treatment) HPI Patient complaining of malaise, sore throat, rhinorrhea, cough for about 12 hours.  Patient was exposed to room mate who was diagnosed with flu.  Patient with fever to 101, took ibuprofen 2 hours pta.  Patient taking po fluids without vomiting but complaining of throat pain.   Past Medical History  Diagnosis Date  . Depression   . Chronic abdominal pain   . Anxiety   . Tourette disease     History reviewed. No pertinent past surgical history.  Family History  Problem Relation Age of Onset  . Ulcerative colitis Father     History  Substance Use Topics  . Smoking status: Current Every Day Smoker -- 1.0 packs/day  . Smokeless tobacco: Not on file  . Alcohol Use: Yes     Comment: occassionally      Review of Systems  All other systems reviewed and are negative.    Allergies  Review of patient's allergies indicates no known allergies.  Home Medications   Current Outpatient Rx  Name  Route  Sig  Dispense  Refill  . HYDROCODONE-ACETAMINOPHEN 5-325 MG PO TABS      1-2 tablets po q 6 hours prn moderate to severe pain   20 tablet   0   . ONDANSETRON 8 MG PO TBDP   Oral   Take 1 tablet (8 mg total) by mouth every 12 (twelve) hours as needed for nausea.   20 tablet   0   . PANTOPRAZOLE SODIUM 40 MG PO TBEC   Oral   Take 1 tablet (40 mg total) by mouth daily.   14 tablet   0   . PROMETHAZINE HCL 25 MG PO TABS   Oral   Take 1 tablet (25 mg total) by mouth every 6 (six) hours as needed for nausea.   20 tablet   0   . TRAMADOL HCL 50 MG PO TABS   Oral   Take 1 tablet (50 mg total) by mouth every 6 (six) hours as needed for pain.   15 tablet   0     BP 121/78  Pulse 93  Temp  98 F (36.7 C) (Oral)  Resp 20  Ht 5\' 9"  (1.753 m)  Wt 135 lb (61.236 kg)  BMI 19.94 kg/m2  SpO2 100%  Physical Exam  Nursing note and vitals reviewed. Constitutional: He is oriented to person, place, and time. He appears well-developed and well-nourished.  HENT:  Head: Normocephalic and atraumatic.  Right Ear: External ear normal.  Left Ear: External ear normal.  Nose: Nose normal.  Mouth/Throat: Oropharynx is clear and moist.  Eyes: Conjunctivae normal and EOM are normal. Pupils are equal, round, and reactive to light.  Neck: Normal range of motion. Neck supple.  Cardiovascular: Normal rate, regular rhythm, normal heart sounds and intact distal pulses.   Pulmonary/Chest: Effort normal and breath sounds normal.  Abdominal: Soft. Bowel sounds are normal.  Musculoskeletal: Normal range of motion.  Neurological: He is alert and oriented to person, place, and time. He has normal reflexes.  Skin: Skin is warm and dry.  Psychiatric: He has a normal mood and affect. His behavior is normal. Thought content normal.    ED Course  Procedures (including critical care  time)   Labs Reviewed  RAPID STREP SCREEN   No results found.   No diagnosis found.    MDM     Patient with ili symptoms- plan tamiflu       Hilario Quarry, MD 02/03/12 1440

## 2012-02-27 ENCOUNTER — Emergency Department (HOSPITAL_BASED_OUTPATIENT_CLINIC_OR_DEPARTMENT_OTHER)
Admission: EM | Admit: 2012-02-27 | Discharge: 2012-02-27 | Disposition: A | Discharge status: Home / Self Care | Payer: Self-pay | Attending: Emergency Medicine | Admitting: Emergency Medicine

## 2012-02-27 ENCOUNTER — Encounter (HOSPITAL_BASED_OUTPATIENT_CLINIC_OR_DEPARTMENT_OTHER): Payer: Self-pay | Admitting: *Deleted

## 2012-02-27 DIAGNOSIS — R110 Nausea: Secondary | ICD-10-CM | POA: Insufficient documentation

## 2012-02-27 DIAGNOSIS — G8929 Other chronic pain: Secondary | ICD-10-CM | POA: Insufficient documentation

## 2012-02-27 DIAGNOSIS — R05 Cough: Secondary | ICD-10-CM | POA: Insufficient documentation

## 2012-02-27 DIAGNOSIS — J3489 Other specified disorders of nose and nasal sinuses: Secondary | ICD-10-CM | POA: Insufficient documentation

## 2012-02-27 DIAGNOSIS — Z79899 Other long term (current) drug therapy: Secondary | ICD-10-CM | POA: Insufficient documentation

## 2012-02-27 DIAGNOSIS — R059 Cough, unspecified: Secondary | ICD-10-CM | POA: Insufficient documentation

## 2012-02-27 DIAGNOSIS — R109 Unspecified abdominal pain: Secondary | ICD-10-CM | POA: Insufficient documentation

## 2012-02-27 DIAGNOSIS — J029 Acute pharyngitis, unspecified: Secondary | ICD-10-CM | POA: Insufficient documentation

## 2012-02-27 DIAGNOSIS — F17200 Nicotine dependence, unspecified, uncomplicated: Secondary | ICD-10-CM | POA: Insufficient documentation

## 2012-02-27 DIAGNOSIS — Z8659 Personal history of other mental and behavioral disorders: Secondary | ICD-10-CM | POA: Insufficient documentation

## 2012-02-27 NOTE — ED Provider Notes (Signed)
History   This chart was scribed for Jeff Bucco, MD by Donne Anon, ED Scribe. This patient was seen in room MH12/MH12 and the patient's care was started at 1504.   CSN: [id number]  Arrival date & time 02/27/12  1354   First MD Initiated Contact with Patient 02/27/12 1504      Chief Complaint  Patient presents with  . Sore Throat     The history is provided by the patient. No language interpreter was used.   Jeff Browning is a 27 y.o. male who presents to the Emergency Department complaining of gradual onset, gradually worsening sore throat which began yesterday morning and worsened last night. He reports associated nausea, rhinorrhea, congestion, cough without sputum, ear pain, teeth pain and neck pain. He denies emesis, abdominal pain, dysuria, rash or any other pain. He has tried DayQuill and Advil with moderate relief.  Past Medical History  Diagnosis Date  . Depression   . Chronic abdominal pain   . Anxiety   . Tourette disease     History reviewed. No pertinent past surgical history.  Family History  Problem Relation Age of Onset  . Ulcerative colitis Father     History  Substance Use Topics  . Smoking status: Current Every Day Smoker -- 1.0 packs/day  . Smokeless tobacco: Not on file  . Alcohol Use: Yes     Comment: occassionally      Review of Systems  Constitutional: Negative for fever, chills, diaphoresis and fatigue.  HENT: Positive for congestion and rhinorrhea. Negative for sneezing.   Eyes: Negative.   Respiratory: Positive for cough. Negative for chest tightness.   Cardiovascular: Negative for leg swelling.  Gastrointestinal: Positive for nausea. Negative for vomiting, diarrhea and blood in stool.  Genitourinary: Negative for frequency, hematuria, flank pain and difficulty urinating.  Musculoskeletal: Negative for back pain and arthralgias.  Skin: Negative for rash.  Neurological: Negative for dizziness, speech difficulty, weakness and  numbness.    Allergies  Review of patient's allergies indicates no known allergies.  Home Medications   Current Outpatient Rx  Name  Route  Sig  Dispense  Refill  . HYDROCODONE-ACETAMINOPHEN 5-325 MG PO TABS      1-2 tablets po q 6 hours prn moderate to severe pain   20 tablet   0   . ONDANSETRON 8 MG PO TBDP   Oral   Take 1 tablet (8 mg total) by mouth every 12 (twelve) hours as needed for nausea.   20 tablet   0   . OSELTAMIVIR PHOSPHATE 75 MG PO CAPS   Oral   Take 1 capsule (75 mg total) by mouth every 12 (twelve) hours.   10 capsule   0   . PANTOPRAZOLE SODIUM 40 MG PO TBEC   Oral   Take 1 tablet (40 mg total) by mouth daily.   14 tablet   0   . PROMETHAZINE HCL 25 MG PO TABS   Oral   Take 1 tablet (25 mg total) by mouth every 6 (six) hours as needed for nausea.   20 tablet   0   . TRAMADOL HCL 50 MG PO TABS   Oral   Take 1 tablet (50 mg total) by mouth every 6 (six) hours as needed for pain.   15 tablet   0     BP 127/78  Pulse 108  Temp 99 F (37.2 C) (Oral)  Resp 18  Ht 5\' 9"  (1.753 m)  Wt 150 lb (68.04  kg)  BMI 22.15 kg/m2  SpO2 98%  Physical Exam  Constitutional: He is oriented to person, place, and time. He appears well-developed and well-nourished.  HENT:  Head: Normocephalic and atraumatic.  Mouth/Throat: No oropharyngeal exudate.       Mild erythema to posterior pharynx. No trismus. Uvula midline.  Eyes: Pupils are equal, round, and reactive to light.  Neck: Normal range of motion. Neck supple.  Cardiovascular: Normal rate, regular rhythm and normal heart sounds.   Pulmonary/Chest: Effort normal and breath sounds normal. No respiratory distress. He has no wheezes. He has no rales. He exhibits no tenderness.  Abdominal: Soft. Bowel sounds are normal. There is no tenderness. There is no rebound and no guarding.  Musculoskeletal: Normal range of motion. He exhibits no edema.  Lymphadenopathy:    He has no cervical adenopathy.    Neurological: He is alert and oriented to person, place, and time.  Skin: Skin is warm and dry. No rash noted.  Psychiatric: He has a normal mood and affect.    ED Course  Procedures (including critical care time) DIAGNOSTIC STUDIES: Oxygen Saturation is 98% on room air, normal by my interpretation.    COORDINATION OF CARE: 3:15 PM Discussed treatment plan which includes Musinex with pt at bedside and pt agreed to plan.      Labs Reviewed  RAPID STREP SCREEN   No results found.   1. Pharyngitis       MDM  Well appearing with neg strept, likely viral.  Advised symptomatic care.  I personally performed the services described in this documentation, which was scribed in my presence.  The recorded information has been reviewed and considered.        Jeff Bucco, MD 02/27/12 1536

## 2012-02-27 NOTE — ED Notes (Signed)
Sore throat onset yesterday

## 2012-09-19 ENCOUNTER — Emergency Department (HOSPITAL_BASED_OUTPATIENT_CLINIC_OR_DEPARTMENT_OTHER): Payer: Medicaid Other

## 2012-09-19 ENCOUNTER — Emergency Department (HOSPITAL_BASED_OUTPATIENT_CLINIC_OR_DEPARTMENT_OTHER)
Admission: EM | Admit: 2012-09-19 | Discharge: 2012-09-19 | Disposition: A | Discharge status: Home / Self Care | Payer: Medicaid Other | Attending: Emergency Medicine | Admitting: Emergency Medicine

## 2012-09-19 ENCOUNTER — Encounter (HOSPITAL_BASED_OUTPATIENT_CLINIC_OR_DEPARTMENT_OTHER): Payer: Self-pay | Admitting: Family Medicine

## 2012-09-19 DIAGNOSIS — R112 Nausea with vomiting, unspecified: Secondary | ICD-10-CM | POA: Insufficient documentation

## 2012-09-19 DIAGNOSIS — R630 Anorexia: Secondary | ICD-10-CM | POA: Insufficient documentation

## 2012-09-19 DIAGNOSIS — F3289 Other specified depressive episodes: Secondary | ICD-10-CM | POA: Insufficient documentation

## 2012-09-19 DIAGNOSIS — F329 Major depressive disorder, single episode, unspecified: Secondary | ICD-10-CM | POA: Insufficient documentation

## 2012-09-19 DIAGNOSIS — F411 Generalized anxiety disorder: Secondary | ICD-10-CM | POA: Insufficient documentation

## 2012-09-19 DIAGNOSIS — G8929 Other chronic pain: Secondary | ICD-10-CM | POA: Insufficient documentation

## 2012-09-19 DIAGNOSIS — F17200 Nicotine dependence, unspecified, uncomplicated: Secondary | ICD-10-CM | POA: Insufficient documentation

## 2012-09-19 DIAGNOSIS — R1110 Vomiting, unspecified: Secondary | ICD-10-CM

## 2012-09-19 DIAGNOSIS — K759 Inflammatory liver disease, unspecified: Secondary | ICD-10-CM

## 2012-09-19 DIAGNOSIS — Z79899 Other long term (current) drug therapy: Secondary | ICD-10-CM | POA: Insufficient documentation

## 2012-09-19 DIAGNOSIS — R1013 Epigastric pain: Secondary | ICD-10-CM | POA: Insufficient documentation

## 2012-09-19 DIAGNOSIS — Z8619 Personal history of other infectious and parasitic diseases: Secondary | ICD-10-CM | POA: Insufficient documentation

## 2012-09-19 DIAGNOSIS — R61 Generalized hyperhidrosis: Secondary | ICD-10-CM | POA: Insufficient documentation

## 2012-09-19 HISTORY — DX: Unspecified viral hepatitis C without hepatic coma: B19.20

## 2012-09-19 LAB — COMPREHENSIVE METABOLIC PANEL
ALT: 408 U/L — ABNORMAL HIGH (ref 0–53)
CO2: 23 mEq/L (ref 19–32)
Calcium: 9.4 mg/dL (ref 8.4–10.5)
Creatinine, Ser: 0.8 mg/dL (ref 0.50–1.35)
GFR calc Af Amer: >90 mL/min (ref >90)
GFR calc non Af Amer: >90 mL/min (ref >90)
Glucose, Bld: 121 mg/dL — ABNORMAL HIGH (ref 70–99)
Sodium: 139 mEq/L (ref 135–145)
Total Protein: 7.1 g/dL (ref 6.0–8.3)

## 2012-09-19 LAB — CBC WITH DIFFERENTIAL/PLATELET
Basophils Relative: 0 % (ref 0–1)
Eosinophils Absolute: 0.1 10*3/uL (ref 0.0–0.7)
Eosinophils Relative: 0 % (ref 0–5)
Hemoglobin: 13.9 g/dL (ref 13.0–17.0)
MCH: 31.7 pg (ref 26.0–34.0)
MCHC: 35.6 g/dL (ref 30.0–36.0)
MCV: 89 fL (ref 78.0–100.0)
Monocytes Relative: 4 % (ref 3–12)
Neutrophils Relative %: 85 % — ABNORMAL HIGH (ref 43–77)
Platelets: 314 10*3/uL (ref 150–400)

## 2012-09-19 MED ORDER — ONDANSETRON HCL 4 MG/2ML IJ SOLN
4.0000 mg | Freq: Once | INTRAMUSCULAR | Status: AC
  Administered 2012-09-19: 4 mg via INTRAVENOUS
  Filled 2012-09-19: qty 2

## 2012-09-19 MED ORDER — ONDANSETRON HCL 4 MG/2ML IJ SOLN
4.0000 mg | Freq: Once | INTRAMUSCULAR | Status: AC
  Administered 2012-09-19: 4 mg via INTRAVENOUS

## 2012-09-19 MED ORDER — HYDROMORPHONE HCL PF 1 MG/ML IJ SOLN
1.0000 mg | Freq: Once | INTRAMUSCULAR | Status: AC
  Administered 2012-09-19: 1 mg via INTRAVENOUS
  Filled 2012-09-19: qty 1

## 2012-09-19 MED ORDER — DIPHENHYDRAMINE HCL 50 MG/ML IJ SOLN
12.5000 mg | Freq: Once | INTRAMUSCULAR | Status: AC
  Administered 2012-09-19: 12.5 mg via INTRAVENOUS
  Filled 2012-09-19: qty 1

## 2012-09-19 MED ORDER — FAMOTIDINE IN NACL 20-0.9 MG/50ML-% IV SOLN
20.0000 mg | Freq: Once | INTRAVENOUS | Status: AC
  Administered 2012-09-19: 20 mg via INTRAVENOUS
  Filled 2012-09-19: qty 50

## 2012-09-19 MED ORDER — OXYCODONE HCL 5 MG PO TABS: 5.0000 mg | ORAL_TABLET | Freq: Four times a day (QID) | ORAL | Status: DC | PRN

## 2012-09-19 MED ORDER — ONDANSETRON HCL 4 MG/2ML IJ SOLN
INTRAMUSCULAR | Status: AC
  Administered 2012-09-19: 4 mg via INTRAVENOUS
  Filled 2012-09-19: qty 2

## 2012-09-19 MED ORDER — SUCRALFATE 1 GM/10ML PO SUSP: 1.0000 g | Freq: Four times a day (QID) | ORAL | Status: DC

## 2012-09-19 MED ORDER — SODIUM CHLORIDE 0.9 % IV SOLN
1000.0000 mL | Freq: Once | INTRAVENOUS | Status: AC
  Administered 2012-09-19: 1000 mL via INTRAVENOUS

## 2012-09-19 MED ORDER — PROMETHAZINE HCL 25 MG PO TABS: 25.0000 mg | ORAL_TABLET | Freq: Four times a day (QID) | ORAL | Status: DC | PRN

## 2012-09-19 NOTE — ED Notes (Signed)
Pt denies drinking etoh or illicit drugs

## 2012-09-19 NOTE — ED Notes (Addendum)
Pt c/o abdominal pain, vomiting x 2 hrs and diarrhea today, brought by [facility]. 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 EMS sts pt was "sticking his finger down his throat to vomit".

## 2012-09-19 NOTE — ED Provider Notes (Signed)
CSN: [id number]     Arrival date & time 09/19/12  1828 History     First MD Initiated Contact with Patient 09/19/12 1856     Chief Complaint  Patient presents with  . Abdominal Pain   (Consider location/radiation/quality/duration/timing/severity/associated sxs/prior Treatment) Patient is a 27 y.o. male presenting with abdominal pain. The history is provided by the patient.  Abdominal Pain Pain location:  Epigastric Pain quality: gnawing, squeezing, stabbing and throbbing   Pain radiates to:  Does not radiate Pain severity:  Severe Onset quality:  Sudden Duration:  2 hours Timing:  Constant Progression:  Unchanged Chronicity:  New Context: retching   Context: not alcohol use, not diet changes, not previous surgeries, not recent illness and not sick contacts   Relieved by:  Nothing Worsened by:  Nothing tried Ineffective treatments:  None tried Associated symptoms: anorexia, nausea and vomiting   Associated symptoms: no diarrhea, no fever, no hematemesis and no shortness of breath   Risk factors: no aspirin use and no NSAID use   Risk factors comment:  Prior hx of etoh, polysubstance abuse but clean for 2 months and in rehab   Past Medical History  Diagnosis Date  . Depression   . Chronic abdominal pain   . Anxiety   . Tourette disease   . Hepatitis C    History reviewed. No pertinent past surgical history. Family History  Problem Relation Age of Onset  . Ulcerative colitis Father    History  Substance Use Topics  . Smoking status: Current Every Day Smoker -- 1.00 packs/day  . Smokeless tobacco: Not on file  . Alcohol Use: Yes     Comment: occassionally    Review of Systems  Constitutional: Negative for fever.  Respiratory: Negative for shortness of breath.   Gastrointestinal: Positive for nausea, vomiting, abdominal pain and anorexia. Negative for diarrhea and hematemesis.  All other systems reviewed and are negative.    Allergies  Acetaminophen  Home  Medications   Current Outpatient Rx  Name  Route  Sig  Dispense  Refill  . PAROXETINE HCL PO   Oral   Take by mouth.         Marland Kitchen HYDROcodone-acetaminophen (NORCO/VICODIN) 5-325 MG per tablet      1-2 tablets po q 6 hours prn moderate to severe pain   20 tablet   0   . ondansetron (ZOFRAN-ODT) 8 MG disintegrating tablet   Oral   Take 1 tablet (8 mg total) by mouth every 12 (twelve) hours as needed for nausea.   20 tablet   0   . oseltamivir (TAMIFLU) 75 MG capsule   Oral   Take 1 capsule (75 mg total) by mouth every 12 (twelve) hours.   10 capsule   0   . pantoprazole (PROTONIX) 40 MG tablet   Oral   Take 1 tablet (40 mg total) by mouth daily.   14 tablet   0   . promethazine (PHENERGAN) 25 MG tablet   Oral   Take 1 tablet (25 mg total) by mouth every 6 (six) hours as needed for nausea.   20 tablet   0   . traMADol (ULTRAM) 50 MG tablet   Oral   Take 1 tablet (50 mg total) by mouth every 6 (six) hours as needed for pain.   15 tablet   0    BP 117/64  Pulse 84  Temp(Src) 97.6 F (36.4 C) (Oral)  Resp 18  SpO2 97% Physical Exam  Nursing  note and vitals reviewed. Constitutional: He is oriented to person, place, and time. He appears well-developed and well-nourished. He appears distressed.  Retching in emesis basin  HENT:  Head: Normocephalic and atraumatic.  Mouth/Throat: Oropharynx is clear and moist.  Eyes: Conjunctivae and EOM are normal. Pupils are equal, round, and reactive to light.  Neck: Normal range of motion. Neck supple.  Cardiovascular: Normal rate, regular rhythm and intact distal pulses.   No murmur heard. Pulmonary/Chest: Effort normal and breath sounds normal. No respiratory distress. He has no wheezes. He has no rales.  Abdominal: Soft. He exhibits no distension. There is tenderness in the epigastric area. There is no rebound, no guarding and no CVA tenderness.  Musculoskeletal: Normal range of motion. He exhibits no edema and no  tenderness.  Neurological: He is alert and oriented to person, place, and time.  Skin: No rash noted. He is diaphoretic. No erythema. There is pallor.  cool  Psychiatric: He has a normal mood and affect. His behavior is normal.    ED Course   Procedures (including critical care time)  Labs Reviewed  CBC WITH DIFFERENTIAL - Abnormal; Notable for the following:    WBC 17.2 (*)    Neutrophils Relative % 85 (*)    Neutro Abs 14.6 (*)    Lymphocytes Relative 11 (*)    All other components within normal limits  COMPREHENSIVE METABOLIC PANEL - Abnormal; Notable for the following:    Glucose, Bld 121 (*)    AST 149 (*)    ALT 408 (*)    All other components within normal limits  LIPASE, BLOOD  URINE RAPID DRUG SCREEN (HOSP PERFORMED)   Dg Abd Acute W/chest  09/19/2012   *RADIOLOGY REPORT*  Clinical Data: Epigastric pain.  Vomiting.  ACUTE ABDOMEN SERIES (ABDOMEN 2 VIEW & CHEST 1 VIEW)  Comparison: Radiographs dated 05/09/2010  Findings: The heart size and pulmonary vascularity are normal and the lungs are clear.  No free air free fluid in the abdomen.  The bowel gas pattern is normal.  No abnormal abdominal calcifications. No osseous abnormality.  IMPRESSION: Normal exam.   Original Report Authenticated By: Francene Boyers, M.D.   1. Hepatitis   2. Epigastric pain   3. Vomiting     MDM   Patient here with acute onset of vomiting and epigastric pain that started 2 hours ago. Patient vomiting numerous times in the exam room and appears distressed. Patient does have a prior history of drug abuse cocaine, narcotics, marijuana and alcohol use but has been in a rehabilitation program and has been clean for 2 months. He denies any recent medication changes and currently is only taking fluoxetine he is been on the same dose for the last month. Patient has epigastric tenderness but no peritoneal findings on exam. Vital signs are within normal limits.  CBC, CMP, lipase, UDS pending.  Patient given  IV fluids and Zofran without improvement in nausea. He was then given Phenergan and Dilaudid. Patient has had similar episodes to this approximately 6 months ago but no cause was found. He denies any diarrhea at this time.  8:22 PM Labs consistent with hepatitis with elevation of AST and ALT to 149 and 408.  Lipase normal and WBC 17,000.  On re-eval pt still having pain in the epigastrium.  Second round of meds and pepcid given.  11:51 PM After third round of meds patient is feeling much better and tolerating by mouth's. Acute abdominal series without acute findings. Patient urged to  seek care with GI for an endoscopy for further evaluation as well as further evaluation of the hepatitis as his LFTs are elevated today. Patient drinks no alcohol and is not taking any medication that would harm the liver at this time. Patient is going to start back on his protonic and Zantac that he has at home. He in addition was given Carafate antimanic and pain control.  Gwyneth Sprout, MD 09/19/12 2352

## 2012-09-24 ENCOUNTER — Encounter (HOSPITAL_BASED_OUTPATIENT_CLINIC_OR_DEPARTMENT_OTHER): Payer: Self-pay | Admitting: *Deleted

## 2012-09-24 ENCOUNTER — Emergency Department (HOSPITAL_BASED_OUTPATIENT_CLINIC_OR_DEPARTMENT_OTHER)
Admission: EM | Admit: 2012-09-24 | Discharge: 2012-09-24 | Disposition: A | Discharge status: Home / Self Care | Payer: Medicaid Other | Attending: Emergency Medicine | Admitting: Emergency Medicine

## 2012-09-24 DIAGNOSIS — Z8659 Personal history of other mental and behavioral disorders: Secondary | ICD-10-CM | POA: Insufficient documentation

## 2012-09-24 DIAGNOSIS — G8929 Other chronic pain: Secondary | ICD-10-CM | POA: Insufficient documentation

## 2012-09-24 DIAGNOSIS — R209 Unspecified disturbances of skin sensation: Secondary | ICD-10-CM | POA: Insufficient documentation

## 2012-09-24 DIAGNOSIS — W108XXA Fall (on) (from) other stairs and steps, initial encounter: Secondary | ICD-10-CM | POA: Insufficient documentation

## 2012-09-24 DIAGNOSIS — S46909A Unspecified injury of unspecified muscle, fascia and tendon at shoulder and upper arm level, unspecified arm, initial encounter: Secondary | ICD-10-CM | POA: Insufficient documentation

## 2012-09-24 DIAGNOSIS — Y929 Unspecified place or not applicable: Secondary | ICD-10-CM | POA: Insufficient documentation

## 2012-09-24 DIAGNOSIS — M25521 Pain in right elbow: Secondary | ICD-10-CM

## 2012-09-24 DIAGNOSIS — Z8619 Personal history of other infectious and parasitic diseases: Secondary | ICD-10-CM | POA: Insufficient documentation

## 2012-09-24 DIAGNOSIS — Y9389 Activity, other specified: Secondary | ICD-10-CM | POA: Insufficient documentation

## 2012-09-24 DIAGNOSIS — Z79899 Other long term (current) drug therapy: Secondary | ICD-10-CM | POA: Insufficient documentation

## 2012-09-24 DIAGNOSIS — F17200 Nicotine dependence, unspecified, uncomplicated: Secondary | ICD-10-CM | POA: Insufficient documentation

## 2012-09-24 DIAGNOSIS — S4980XA Other specified injuries of shoulder and upper arm, unspecified arm, initial encounter: Secondary | ICD-10-CM | POA: Insufficient documentation

## 2012-09-24 MED ORDER — TRAMADOL HCL 50 MG PO TABS: 50.0000 mg | ORAL_TABLET | Freq: Four times a day (QID) | ORAL | Status: DC | PRN

## 2012-09-24 MED ORDER — KETOROLAC TROMETHAMINE 30 MG/ML IJ SOLN
30.0000 mg | Freq: Once | INTRAMUSCULAR | Status: AC
  Administered 2012-09-24: 30 mg via INTRAMUSCULAR
  Filled 2012-09-24: qty 1

## 2012-09-24 MED ORDER — OXYCODONE HCL 5 MG PO TABS
5.0000 mg | ORAL_TABLET | Freq: Once | ORAL | Status: AC
  Administered 2012-09-24: 5 mg via ORAL
  Filled 2012-09-24: qty 1
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
## 2012-09-24 NOTE — ED Notes (Signed)
EDP with patient. 
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
## 2012-09-24 NOTE — ED Notes (Signed)
Pt has called his mother to come and pick him up and drive home, pt aware of narcotic warnings

## 2012-10-02 ENCOUNTER — Encounter (HOSPITAL_COMMUNITY): Payer: Self-pay | Admitting: Emergency Medicine

## 2012-10-02 ENCOUNTER — Emergency Department (HOSPITAL_COMMUNITY)
Admission: EM | Admit: 2012-10-02 | Discharge: 2012-10-03 | Disposition: A | Discharge status: Other Acute Inpatient Hospital | Payer: Medicaid Other | Attending: Emergency Medicine | Admitting: Emergency Medicine

## 2012-10-02 DIAGNOSIS — R45851 Suicidal ideations: Secondary | ICD-10-CM | POA: Insufficient documentation

## 2012-10-02 DIAGNOSIS — Z8619 Personal history of other infectious and parasitic diseases: Secondary | ICD-10-CM | POA: Insufficient documentation

## 2012-10-02 DIAGNOSIS — F329 Major depressive disorder, single episode, unspecified: Secondary | ICD-10-CM

## 2012-10-02 DIAGNOSIS — F1910 Other psychoactive substance abuse, uncomplicated: Secondary | ICD-10-CM | POA: Insufficient documentation

## 2012-10-02 DIAGNOSIS — F3289 Other specified depressive episodes: Secondary | ICD-10-CM | POA: Insufficient documentation

## 2012-10-02 DIAGNOSIS — F17200 Nicotine dependence, unspecified, uncomplicated: Secondary | ICD-10-CM | POA: Insufficient documentation

## 2012-10-02 DIAGNOSIS — R4182 Altered mental status, unspecified: Secondary | ICD-10-CM | POA: Insufficient documentation

## 2012-10-02 DIAGNOSIS — Z79899 Other long term (current) drug therapy: Secondary | ICD-10-CM | POA: Insufficient documentation

## 2012-10-02 DIAGNOSIS — G8929 Other chronic pain: Secondary | ICD-10-CM | POA: Insufficient documentation

## 2012-10-02 DIAGNOSIS — F411 Generalized anxiety disorder: Secondary | ICD-10-CM | POA: Insufficient documentation

## 2012-10-02 LAB — CBC
HCT: 41.5 % (ref 39.0–52.0)
Hemoglobin: 14.7 g/dL (ref 13.0–17.0)
MCH: 31.6 pg (ref 26.0–34.0)
MCHC: 35.4 g/dL (ref 30.0–36.0)
RDW: 13.3 % (ref 11.5–15.5)

## 2012-10-02 LAB — COMPREHENSIVE METABOLIC PANEL
Albumin: 3.9 g/dL (ref 3.5–5.2)
Alkaline Phosphatase: 82 U/L (ref 39–117)
BUN: 12 mg/dL (ref 6–23)
Calcium: 9.6 mg/dL (ref 8.4–10.5)
GFR calc Af Amer: >90 mL/min (ref >90)
Glucose, Bld: 114 mg/dL — ABNORMAL HIGH (ref 70–99)
Potassium: 3.8 mEq/L (ref 3.5–5.1)
Sodium: 137 mEq/L (ref 135–145)
Total Protein: 7.2 g/dL (ref 6.0–8.3)

## 2012-10-02 LAB — RAPID URINE DRUG SCREEN, HOSP PERFORMED
Benzodiazepines: POSITIVE — AB
Cocaine: NOT DETECTED
Opiates: NOT DETECTED

## 2012-10-02 LAB — URINALYSIS, ROUTINE W REFLEX MICROSCOPIC
Bilirubin Urine: NEGATIVE
Glucose, UA: NEGATIVE mg/dL
Hgb urine dipstick: NEGATIVE
Ketones, ur: NEGATIVE mg/dL
Specific Gravity, Urine: 1.025 (ref 1.005–1.030)
pH: 6 (ref 5.0–8.0)

## 2012-10-02 LAB — ETHANOL: Alcohol, Ethyl (B): <11 mg/dL (ref 0–11)

## 2012-10-02 LAB — ACETAMINOPHEN LEVEL: Acetaminophen (Tylenol), Serum: <15 ug/mL (ref 10–30)

## 2012-10-02 MED ORDER — PAROXETINE HCL 10 MG PO TABS
10.0000 mg | ORAL_TABLET | Freq: Every day | ORAL | Status: DC
  Administered 2012-10-02 – 2012-10-03 (×2): 10 mg via ORAL
  Filled 2012-10-02 (×2): qty 1

## 2012-10-02 MED ORDER — PANTOPRAZOLE SODIUM 40 MG PO TBEC
40.0000 mg | DELAYED_RELEASE_TABLET | Freq: Every day | ORAL | Status: DC
  Administered 2012-10-02 – 2012-10-03 (×2): 40 mg via ORAL
  Filled 2012-10-02 (×2): qty 1

## 2012-10-02 MED ORDER — NICOTINE 21 MG/24HR TD PT24
21.0000 mg | MEDICATED_PATCH | Freq: Once | TRANSDERMAL | Status: DC
  Administered 2012-10-02: 21 mg via TRANSDERMAL
  Filled 2012-10-02: qty 1

## 2012-10-02 MED ORDER — SUCRALFATE 1 GM/10ML PO SUSP
1.0000 g | Freq: Four times a day (QID) | ORAL | Status: DC
  Administered 2012-10-02 – 2012-10-03 (×2): 1 g via ORAL
  Filled 2012-10-02 (×6): qty 10
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
## 2012-10-02 NOTE — ED Notes (Signed)
Patient has one bag of belongings on locker 30.

## 2012-10-03 ENCOUNTER — Encounter (HOSPITAL_COMMUNITY): Payer: Self-pay | Admitting: *Deleted

## 2012-10-03 ENCOUNTER — Inpatient Hospital Stay (HOSPITAL_COMMUNITY)
Admission: EM | Admit: 2012-10-03 | Discharge: 2012-10-06 | DRG: 885 | Disposition: A | Discharge status: Home / Self Care | Payer: Medicaid Other | Source: Intra-hospital | Attending: Psychiatry | Admitting: Psychiatry

## 2012-10-03 DIAGNOSIS — F1120 Opioid dependence, uncomplicated: Secondary | ICD-10-CM

## 2012-10-03 DIAGNOSIS — F952 Tourette's disorder: Secondary | ICD-10-CM | POA: Diagnosis present

## 2012-10-03 DIAGNOSIS — F1020 Alcohol dependence, uncomplicated: Secondary | ICD-10-CM

## 2012-10-03 DIAGNOSIS — F332 Major depressive disorder, recurrent severe without psychotic features: Secondary | ICD-10-CM

## 2012-10-03 DIAGNOSIS — F411 Generalized anxiety disorder: Secondary | ICD-10-CM | POA: Diagnosis present

## 2012-10-03 DIAGNOSIS — B1920 Unspecified viral hepatitis C without hepatic coma: Secondary | ICD-10-CM

## 2012-10-03 DIAGNOSIS — Z79899 Other long term (current) drug therapy: Secondary | ICD-10-CM

## 2012-10-03 DIAGNOSIS — F1210 Cannabis abuse, uncomplicated: Secondary | ICD-10-CM | POA: Diagnosis present

## 2012-10-03 DIAGNOSIS — R4589 Other symptoms and signs involving emotional state: Secondary | ICD-10-CM

## 2012-10-03 DIAGNOSIS — F1994 Other psychoactive substance use, unspecified with psychoactive substance-induced mood disorder: Secondary | ICD-10-CM

## 2012-10-03 MED ORDER — FLUOXETINE HCL 20 MG PO CAPS
40.0000 mg | ORAL_CAPSULE | Freq: Every morning | ORAL | Status: DC
  Administered 2012-10-04 – 2012-10-06 (×3): 40 mg via ORAL
  Filled 2012-10-03: qty 2
  Filled 2012-10-03: qty 28
  Filled 2012-10-03 (×3): qty 2

## 2012-10-03 MED ORDER — NICOTINE 21 MG/24HR TD PT24
21.0000 mg | MEDICATED_PATCH | Freq: Every day | TRANSDERMAL | Status: DC
  Administered 2012-10-03 – 2012-10-06 (×4): 21 mg via TRANSDERMAL
  Filled 2012-10-03 (×7): qty 1

## 2012-10-03 MED ORDER — DOXEPIN HCL 25 MG PO CAPS
25.0000 mg | ORAL_CAPSULE | Freq: Every day | ORAL | Status: DC
  Administered 2012-10-03: 25 mg via ORAL
  Filled 2012-10-03 (×4): qty 1

## 2012-10-03 MED ORDER — CLONIDINE HCL 0.1 MG PO TABS
0.1000 mg | ORAL_TABLET | Freq: Two times a day (BID) | ORAL | Status: DC
  Administered 2012-10-03 – 2012-10-05 (×5): 0.1 mg via ORAL
  Filled 2012-10-03 (×11): qty 1

## 2012-10-03 MED ORDER — MAGNESIUM HYDROXIDE 400 MG/5ML PO SUSP: 30.0000 mL | Freq: Every day | ORAL | Status: DC | PRN

## 2012-10-03 MED ORDER — ALUM & MAG HYDROXIDE-SIMETH 200-200-20 MG/5ML PO SUSP: 30.0000 mL | ORAL | Status: DC | PRN
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
## 2012-10-03 NOTE — Progress Notes (Signed)
Adult Psychoeducational Group Note  Date:  10/03/2012 Time:  10:12 PM  Group Topic/Focus:  Healthy Communication:   The focus of this group is to discuss communication, barriers to communication, as well as healthy ways to communicate with others.  Participation Level:  Active  Participation Quality:  Appropriate, Attentive, Sharing and Supportive  Affect:  Appropriate  Cognitive:  Alert, Appropriate and Oriented  Insight: Appropriate and Good  Engagement in Group:  Engaged and Supportive  Modes of Intervention:  Problem-solving, Socialization and Support  Additional Comments:  Jeff Browning engaged in group and came up ways to not hate himself.  He provided positive feedback with others in the group.      [name] 10/03/2012, 10:12 PM

## 2012-10-03 NOTE — BH Assessment (Signed)
Writer called pt's RN Velna Hatchet to notify her pt has been accepted to bed 500-2 by Donell Sievert PA-C to [facility]. Velna Hatchet will have pt fill out consent to release info.   Evette Cristal, [place] Assessment Counselor
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
## 2012-10-03 NOTE — ED Notes (Signed)
Report given to Clinical biochemist at [facility] police transport.

## 2012-10-03 NOTE — Tx Team (Signed)
Initial Interdisciplinary Treatment Plan  PATIENT STRENGTHS: (choose at least two) Ability for insight Average or above average intelligence Capable of independent living General fund of knowledge Physical Health Supportive family/friends  PATIENT STRESSORS: Financial difficulties Marital or family conflict Substance abuse   PROBLEM LIST: Problem List/Patient Goals Date to be addressed Date deferred Reason deferred Estimated date of resolution                                                         DISCHARGE CRITERIA:  Ability to meet basic life and health needs Improved stabilization in mood, thinking, and/or behavior Need for constant or close observation no longer present Withdrawal symptoms are absent or subacute and managed without 24-hour nursing intervention  PRELIMINARY DISCHARGE PLAN: Outpatient therapy Return to previous living arrangement  PATIENT/FAMIILY INVOLVEMENT: This treatment plan has been presented to and reviewed with the patient, Jeff Browning.  The patient and family have been given the opportunity to ask questions and make suggestions.  Jeff Browning 10/03/2012, 12:41 PM

## 2012-10-03 NOTE — ED Notes (Signed)
Tele-assessment with ACT team Terri in progress at present.
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
## 2012-10-03 NOTE — Progress Notes (Signed)
Patient ID: Jeff Browning, male   DOB: [birth year], 27 y.o.   MRN: [id number] Patient came in after cutting himself and the left forearm in three areas and one site has 5 staples; patient has use of thc and heroin and in the past has been prescribed benzodiazepines; patient also reports etoh use and drinking 12 beers daily; reports last use of heroin about month and half ago and last etoh use was 10/02/12; patient uds was positive for thc and benzodiazepines; patient denies SI/HI and A/V hallucinations; patient is flat and depressed and rates it 6/10; anxiety 8/10; patient home medications is in the medication reconciliation area of epic; patient has a history of tourette's, abdominal pain, depression, hepatitis C, anxiety; patient has an admission on 10/09/12 at [facility] and patient would like to attend

## 2012-10-03 NOTE — ED Provider Notes (Signed)
Pt accepted by Dr. Rosana Fret at [facility] and will be transferred this morning.  Pt aware & agreeable to plan.    1. Suicidal ideations   2. Depression   3. Polysubstance abuse      Shanna Cisco, MD 10/03/12 [phone]

## 2012-10-03 NOTE — Progress Notes (Signed)
Pt states "[facility] is his family doctor" in [place] EPIC updated

## 2012-10-04 DIAGNOSIS — F329 Major depressive disorder, single episode, unspecified: Secondary | ICD-10-CM

## 2012-10-04 DIAGNOSIS — F10229 Alcohol dependence with intoxication, unspecified: Secondary | ICD-10-CM

## 2012-10-04 DIAGNOSIS — F1120 Opioid dependence, uncomplicated: Secondary | ICD-10-CM

## 2012-10-04 DIAGNOSIS — F1110 Opioid abuse, uncomplicated: Secondary | ICD-10-CM

## 2012-10-04 DIAGNOSIS — F1220 Cannabis dependence, uncomplicated: Secondary | ICD-10-CM

## 2012-10-04 DIAGNOSIS — F1210 Cannabis abuse, uncomplicated: Secondary | ICD-10-CM

## 2012-10-04 DIAGNOSIS — F1994 Other psychoactive substance use, unspecified with psychoactive substance-induced mood disorder: Secondary | ICD-10-CM

## 2012-10-04 MED ORDER — DOXEPIN HCL 50 MG PO CAPS
50.0000 mg | ORAL_CAPSULE | Freq: Every day | ORAL | Status: DC
  Administered 2012-10-04 – 2012-10-05 (×2): 50 mg via ORAL
  Filled 2012-10-04: qty 14
  Filled 2012-10-04 (×3): qty 1

## 2012-10-04 NOTE — Tx Team (Signed)
Interdisciplinary Treatment Plan Update (Adult)  Date: 10/04/2012  Time Reviewed:  9:45 AM  Progress in Treatment: Attending groups: Yes Participating in groups:  Yes Taking medication as prescribed:  Yes Tolerating medication:  Yes Family/Significant othe contact made: CSW assessing  Patient understands diagnosis:  Yes Discussing patient identified problems/goals with staff:  Yes Medical problems stabilized or resolved:  Yes Denies suicidal/homicidal ideation: Yes Issues/concerns per patient self-inventory:  Yes Other:  New problem(s) identified: N/A  Discharge Plan or Barriers: CSW assessing for appropriate referrals.  Reason for Continuation of Hospitalization: Anxiety Depression Medication Stabilization  Comments: N/A  Estimated length of stay: 3-5 days  For review of initial/current patient goals, please see plan of care.  Attendees: Patient:     Family:     Physician:  Dr. Johnalagadda 10/04/2012 10:19 AM   Nursing:   Cynthia Goble, RN 10/04/2012 10:19 AM   Clinical Social Worker:  Kira Hartl Horton, LCSWA 10/04/2012 10:19 AM   Other: Neil Mashburn, PA 10/04/2012 10:19 AM   Other:  Carol Davis, RN 10/04/2012 10:19 AM   Other:  Quylle Hodnett, LCSW 10/04/2012 10:19 AM   Other:  Brittany Tyson, RN 10/04/2012 10:19 AM   Other: Jennifer Clark, RN case manager 10/04/2012 10:19 AM   Other:    Other:    Other:    Other:    Other:     Scribe for Treatment Team:   Horton, Colton Tassin Nicole, 10/04/2012 10:19 AM 

## 2012-10-04 NOTE — Progress Notes (Signed)
Recreation Therapy Notes   Date: 09.03.2014 Time: 3:00pm Location: [address] Dayroom   Group Topic: Communication, Team Building, Problem Solving  Goal Area(s) Addresses:  Patient will effectively work with peer towards shared goal.  Patient will identify skill used to make activity successful.  Patient will identify how skills used during activity can be used to reach post d/c goals.   Behavioral Response: Attentive, Appropriate, Engaged  Intervention: Problem Solving Activity  Activity: Life Boat. Patients were given a scenario about being on a sinking yacht. Patients were informed the yacht included 15 guest, 8 of which could be placed on the life boat, along with all group members. Individuals on guest list were of varying socioeconomic classes such as a Education officer, museum, Materials engineer, Midwife, Geophysical data processor.   Education: Customer service manager, Discharge Planning  Education Outcome: Acknowledges understanding   Clinical Observations/Feedback: Patient contributed to opening discussion, assisting peers with coming up with a group definition for personal development. Patient actively participated in group activity, voicing his opinion and debating with peers appropriately. Patient contributed to wrap up discussion identifying skills needed to guide decision making process, such as experience and morals. Patient additionally identified team work and communication as a Marketing executive necessary to make activities successful. Patient successfully related team work to personal development, as it was defined by group.    Marykay Lex Dalexa Gentz, LRT/CTRS  Margurete Guaman L 10/04/2012, 4:39 PM

## 2012-10-04 NOTE — BHH Suicide Risk Assessment (Signed)
Suicide Risk Assessment  Admission Assessment     Nursing information obtained from:  Patient Demographic factors:  Male;Adolescent or young adult;Caucasian;Low socioeconomic status;Unemployed Current Mental Status:  NA Loss Factors:  Decrease in vocational status;Financial problems / change in socioeconomic status Historical Factors:  Family history of suicide;Family history of mental illness or substance abuse Risk Reduction Factors:  Pregnancy;Responsible for children under 33 years of age;Sense of responsibility to family  CLINICAL FACTORS:   Severe Anxiety and/or Agitation Depression:   Aggression Comorbid alcohol abuse/dependence Hopelessness Impulsivity Insomnia Recent sense of peace/wellbeing Severe Alcohol/Substance Abuse/Dependencies Previous Psychiatric Diagnoses and Treatments Medical Diagnoses and Treatments/Surgeries  COGNITIVE FEATURES THAT CONTRIBUTE TO RISK:  Closed-mindedness Loss of executive function Polarized thinking Thought constriction (tunnel vision)    SUICIDE RISK:   Moderate:  Frequent suicidal ideation with limited intensity, and duration, some specificity in terms of plans, no associated intent, good self-control, limited dysphoria/symptomatology, some risk factors present, and identifiable protective factors, including available and accessible social support.  PLAN OF CARE: Patient admitted voluntarily and emergently from [facility] for depression, anxiety and substance dependence, and suicidal attempt with SIB.   I certify that inpatient services furnished can reasonably be expected to improve the patient's condition.   Nehemiah Settle., MD 10/04/2012, 1:30 PM
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
## 2012-10-04 NOTE — BHH Group Notes (Signed)
Adult Psychoeducational Group Note  Date:  10/04/2012 Time:  9:08 PM  Group Topic/Focus:  Wrap-Up Group:   The focus of this group is to help patients review their daily goal of treatment and discuss progress on daily workbooks.  Participation Level:  Active  Participation Quality:  Appropriate  Affect:  Flat  Cognitive:  Appropriate and Oriented  Insight: Improving  Engagement in Group:  Developing/Improving  Modes of Intervention:  Discussion and Support  Additional Comments:  Pt stated that his goal for today was to stay positive which he reports he is doing pretty good but that when he goes to bed is when it typically gets worse. Pt rated his day an 5 out 10 stating that he just felt average/in the middle.   Dwain Sarna P 10/04/2012, 9:08 PM

## 2012-10-04 NOTE — Progress Notes (Signed)
Patient ID: Jeff Browning, male   DOB: [birth year], 27 y.o.   MRN: [id number] D: Patient in dayroom on approach. Pt stated he is doing well. Pt mood/affect seemed depressed with  flat affect. Pt rated depression and anxiety as 0 on a 0-10 scale. Pt is observed talking and watching TV with peers in the dayroom. Pt denies SI/HI/AVH and pain. Pt attended evening wrap up group and was engaged in discussion.  Pt denies any needs or concerns.  Cooperative with assessment. No acute distressed noted at this time.   A: Met with pt 1:1. Medications administered as prescribed. Writer encouraged pt to discuss feelings. Pt encouraged to come to staff with any question or concerns. 15 minutes checks for safety.  R: Patient remains safe. He is complaint with medications and denies any adverse reaction. Continue current POC.

## 2012-10-04 NOTE — Progress Notes (Signed)
D: Patient denies SI/HI and A/V hallucinations; patient reports sleep is fair; reports appetite is good; reports energy level is normal ; reports ability to pay attention is improving; rates depression as 5/10; rates hopelessness 5/10;   A: Monitored q 15 minutes; patient encouraged to attend groups; patient educated about medications; patient given medications per physician orders; patient encouraged to express feelings and/or concerns  R: Patient is appropriate and focused on being discharged; patient's interaction with staff and peers is appropriate.; patient was able to set goal to talk with staff 1:1 when having feelings of SI; patient is taking medications as prescribed and tolerating medications; patient is attending some groups

## 2012-10-04 NOTE — Progress Notes (Signed)
[facility] MD Progress Note  10/04/2012 1:33 PM DA AUTHEMENT  MRN:  [id number] Subjective:  Patient complaints of insomnia. Patient stated that he does not want to be here but is here so he is willing to cooperate and working with the program. He has depression rated as 3/10 and anxiety 5/10 and denied current suicidal thoughts. He is frustrated and impulsive when he cut himself after he was accused by using drugs which he did not, reportedly he felt his supportive people are not supportive and unable to understand his determination of weaning off his street drugs. He regrets his SIB and stated embrassed, think it is as childish and idiotic behaviors. He has been tolerating his medication and no side effects noted. He was previously seen by [facility] in [facility] and recently at [facility]. He says he has one son (6) and one child is due and he want them to grow without drugs being around. His mom and dad drinks alcohol every day after work and his brother used to do cannabis abuse.   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    ADL's:  Impaired  Sleep: Fair  Appetite:  Fair  Suicidal Ideation:  Patient has suicidal gesture and behavior of cutting his wrist and sister petitioned him for IVC's. Homicidal Ideation:  Denied AEB (as evidenced by):  Psychiatric Specialty Exam: ROS  Blood pressure 122/81, pulse 63, temperature 98 F (36.7 C), temperature source Oral, resp. rate 16, height 5\' 8"  (1.727 m), weight 62.596 kg (138 lb).Body mass index is 20.99 kg/(m^2).   General Appearance: Disheveled  Eye Contact::  Minimal  Speech:  Clear and Coherent and Normal Rate  Volume:  Decreased  Mood:  Anxious, Depressed, Hopeless and Worthless  Affect:  Congruent, Constricted and Depressed  Thought Process:  Goal Directed and Intact  Orientation:  Full (Time, Place, and Person)  Thought Content:  Obsessions and Rumination  Suicidal Thoughts:  Yes.  with intent/plan  Homicidal Thoughts:  No  Memory:  Immediate;   Fair  Judgement:  Impaired  Insight:  Lacking  Psychomotor Activity:  Decreased and Psychomotor Retardation  Concentration:  Fair  Recall:  Fair  Akathisia:  NA  Handed:  Right  AIMS (if indicated):     Assets:  Communication Skills Desire for Improvement Housing Leisure Time Physical Health Resilience Social Support Transportation  Sleep:      Current Medications: Current Facility-Administered Medications  Medication Dose Route Frequency Provider Last Rate Last Dose  . alum & mag hydroxide-simeth (MAALOX/MYLANTA) 200-200-20 MG/5ML suspension 30 mL  30 mL Oral Q4H PRN Verne Spurr, PA-C      . cloNIDine (CATAPRES) tablet 0.1 mg  0.1 mg Oral BID Verne Spurr, PA-C   0.1 mg at 10/04/12 0818  . doxepin (SINEQUAN) capsule 25 mg  25 mg Oral QHS Verne Spurr, PA-C   25 mg at 10/03/12 2140  . FLUoxetine (PROZAC) capsule 40 mg  40 mg Oral q morning - 10a Verne Spurr, PA-C   40 mg at 10/04/12 0819  . magnesium hydroxide (MILK OF MAGNESIA) suspension 30 mL  30 mL Oral Daily PRN Verne Spurr, PA-C      .  nicotine (NICODERM CQ - dosed in mg/24 hours) patch 21 mg  21 mg Transdermal Daily Nehemiah Settle, MD   21 mg at 10/04/12 4098    Lab Results:  Results for orders placed during the hospital encounter of 10/02/12 (from the past 48 hour(s))  URINE RAPID DRUG SCREEN (HOSP PERFORMED)     Status: Abnormal   Collection Time    10/02/12  4:05 PM      Result Value Range   Opiates NONE DETECTED  NONE DETECTED   Cocaine NONE DETECTED   NONE DETECTED   Benzodiazepines POSITIVE (*) NONE DETECTED   Amphetamines NONE DETECTED  NONE DETECTED   Tetrahydrocannabinol POSITIVE (*) NONE DETECTED   Barbiturates NONE DETECTED  NONE DETECTED   Comment:            DRUG SCREEN FOR MEDICAL PURPOSES     ONLY.  IF CONFIRMATION IS NEEDED     FOR ANY PURPOSE, NOTIFY LAB     WITHIN 5 DAYS.                LOWEST DETECTABLE LIMITS     FOR URINE DRUG SCREEN     Drug Class       Cutoff (ng/mL)     Amphetamine      1000     Barbiturate      200     Benzodiazepine   200     Tricyclics       300     Opiates          300     Cocaine          300     THC              50  URINALYSIS, ROUTINE W REFLEX MICROSCOPIC     Status: Abnormal   Collection Time    10/02/12  4:05 PM      Result Value Range   Color, Urine YELLOW  YELLOW   APPearance CLOUDY (*) CLEAR   Specific Gravity, Urine 1.025  1.005 - 1.030   pH 6.0  5.0 - 8.0   Glucose, UA NEGATIVE  NEGATIVE mg/dL   Hgb urine dipstick NEGATIVE  NEGATIVE   Bilirubin Urine NEGATIVE  NEGATIVE   Ketones, ur NEGATIVE  NEGATIVE mg/dL   Protein, ur NEGATIVE  NEGATIVE mg/dL   Urobilinogen, UA 1.0  0.0 - 1.0 mg/dL   Nitrite NEGATIVE  NEGATIVE   Leukocytes, UA NEGATIVE  NEGATIVE   Comment: MICROSCOPIC NOT DONE ON URINES WITH NEGATIVE PROTEIN, BLOOD, LEUKOCYTES, NITRITE, OR GLUCOSE <1000 mg/dL.  ACETAMINOPHEN LEVEL     Status: None   Collection Time    10/02/12  4:11 PM      Result Value Range   Acetaminophen (Tylenol), Serum <15.0  10 - 30 ug/mL   Comment:            THERAPEUTIC CONCENTRATIONS VARY     SIGNIFICANTLY. A RANGE OF 10-30     ug/mL MAY BE AN EFFECTIVE     CONCENTRATION FOR MANY PATIENTS.     HOWEVER, SOME ARE BEST TREATED     AT CONCENTRATIONS OUTSIDE THIS     RANGE.     ACETAMINOPHEN CONCENTRATIONS     >150 ug/mL AT 4 HOURS AFTER     INGESTION AND >50 ug/mL AT 12     HOURS AFTER INGESTION ARE     OFTEN ASSOCIATED WITH TOXIC     REACTIONS.  CBC  Status: Abnormal    Collection Time    10/02/12  4:11 PM      Result Value Range   WBC 12.2 (*) 4.0 - 10.5 K/uL   RBC 4.65  4.22 - 5.81 MIL/uL   Hemoglobin 14.7  13.0 - 17.0 g/dL   HCT 16.1  09.6 - 04.5 %   MCV 89.2  78.0 - 100.0 fL   MCH 31.6  26.0 - 34.0 pg   MCHC 35.4  30.0 - 36.0 g/dL   RDW 40.9  81.1 - 91.4 %   Platelets 349  150 - 400 K/uL  COMPREHENSIVE METABOLIC PANEL     Status: Abnormal   Collection Time    10/02/12  4:11 PM      Result Value Range   Sodium 137  135 - 145 mEq/L   Potassium 3.8  3.5 - 5.1 mEq/L   Chloride 100  96 - 112 mEq/L   CO2 30  19 - 32 mEq/L   Glucose, Bld 114 (*) 70 - 99 mg/dL   BUN 12  6 - 23 mg/dL   Creatinine, Ser 7.82  0.50 - 1.35 mg/dL   Calcium 9.6  8.4 - 95.6 mg/dL   Total Protein 7.2  6.0 - 8.3 g/dL   Albumin 3.9  3.5 - 5.2 g/dL   AST 213 (*) 0 - 37 U/L   ALT 351 (*) 0 - 53 U/L   Alkaline Phosphatase 82  39 - 117 U/L   Total Bilirubin 0.3  0.3 - 1.2 mg/dL   GFR calc non Af Amer >90  >90 mL/min   GFR calc Af Amer >90  >90 mL/min   Comment: (NOTE)     The eGFR has been calculated using the CKD EPI equation.     This calculation has not been validated in all clinical situations.     eGFR's persistently <90 mL/min signify possible Chronic Kidney     Disease.  ETHANOL     Status: None   Collection Time    10/02/12  4:11 PM      Result Value Range   Alcohol, Ethyl (B) <11  0 - 11 mg/dL   Comment:            LOWEST DETECTABLE LIMIT FOR     SERUM ALCOHOL IS 11 mg/dL     FOR MEDICAL PURPOSES ONLY  SALICYLATE LEVEL     Status: Abnormal   Collection Time    10/02/12  4:11 PM      Result Value Range   Salicylate Lvl <2.0 (*) 2.8 - 20.0 mg/dL    Physical Findings: AIMS: Facial and Oral Movements Muscles of Facial Expression: None, normal Lips and Perioral Area: None, normal Jaw: None, normal Tongue: None, normal,Extremity Movements Upper (arms, wrists, hands, fingers): None, normal Lower (legs, knees, ankles, toes): None, normal, Trunk  Movements Neck, shoulders, hips: None, normal, Overall Severity Severity of abnormal movements (highest score from questions above): None, normal Incapacitation due to abnormal movements: None, normal Patient's awareness of abnormal movements (rate only patient's report): No Awareness, Dental Status Current problems with teeth and/or dentures?: No Does patient usually wear dentures?: No  CIWA:  CIWA-Ar Total: 0 COWS:  COWS Total Score: 0  Treatment Plan Summary: Daily contact with patient to assess and evaluate symptoms and progress in treatment Medication management  Plan: Continue Prozac 40 mg PO QD from [facility] in [employer] Increase Sinequan 25 mg ii PO Qhs from [facility] Continue Catapres 0.1 mg  BID and Nicotine patch Treatment Plan/Recommendations:  1. Admit for crisis management and stabilization. 2. Medication management to reduce current symptoms to base line and improve the patient's overall level of functioning. 3. Treat health problems as indicated. 4. Develop treatment plan to decrease risk of relapse upon discharge and to reduce the need for readmission. 5. Psycho-social education regarding relapse prevention and self care. 6. Health care follow up as needed for medical problems. 7. Restart home medications where appropriate. 8. Disposition plans in progress and [facility] given appointment on 10/09/12.  Medical Decision Making Problem Points:  New problem, with no additional work-up planned (3), Review of last therapy session (1) and Review of psycho-social stressors (1) Data Points:  Review or order clinical lab tests (1) Review or order medicine tests (1) Review and summation of old records (2) Review of medication regiment & side effects (2) Review of new medications or change in dosage (2)  I certify that inpatient services furnished can reasonably be expected to improve the patient's condition.   Jmari Pelc,JANARDHAHA R. 10/04/2012, 1:33 PM

## 2012-10-04 NOTE — [facility] Group Notes (Signed)
[facility] LCSW Group Therapy  10/04/2012  1:15 PM   Type of Therapy:  Group Therapy  Participation Level:  Active  Participation Quality:  Appropriate and Attentive  Affect:  Appropriate  Cognitive:  Alert and Appropriate  Insight:  Developing/Improving and Engaged  Engagement in Therapy:  Developing/Improving and Engaged  Modes of Intervention:  Clarification, Confrontation, Discussion, Education, Exploration, Limit-setting, Orientation, Problem-solving, Rapport Building, Dance movement psychotherapist, Socialization and Support  Summary of Progress/Problems: The topic for group today was emotional regulation.  This group focused on both positive and negative emotion identification and allowed group members to process ways to identify feelings, regulate negative emotions, and find healthy ways to manage internal/external emotions. Group members were asked to reflect on a time when their reaction to an emotion led to a negative outcome and explored how alternative responses using emotion regulation would have benefited them. Group members were also asked to discuss a time when emotion regulation was utilized when a negative emotion was experienced.  Pt shared that he has been drinking heavily and while under the influence he made a "stupid mistake", cutting his arm, which resulted in an IVC.  Pt states that he has learned that wasn't the best way to handle his emotions and is making this hospital experience positive.  Pt states that he has a clearer mind now and is able to reflect on his behaviors and is here to learn.  Pt used the example of being told by the doctor he wasn't going home and instead of getting upset or angry he will use it to learn more.  Pt was insightful into his issues and actively participated in group discussion.    Reyes Ivan, LCSWA 10/04/2012 2:42 PM

## 2012-10-05 NOTE — Progress Notes (Signed)
Patient ID: Jeff Browning, male   DOB: 1985-12-28, 27 y.o.   MRN: [id number] D: Patient in dayroom on approach. Pt stated he is doing well. Pt mood/affect appeared anxious. Pt rated depression as 0 anxiety as 3 on a 0-10 scale. Pt is observed talking and watching TV with peers in the dayroom. Pt denies SI/HI/AVH and pain. Pt attended evening wrap up group and was engaged in discussion.  Pt denies any needs or concerns.  Cooperative with assessment. No acute distressed noted at this time.   A: Met with pt 1:1. Medications administered as prescribed. Writer encouraged pt to discuss feelings. Pt encouraged to come to staff with any question or concerns. 15 minutes checks for safety.  R: Patient remains safe. He is complaint with medications and denies any adverse reaction. Continue current POC.

## 2012-10-05 NOTE — Progress Notes (Signed)
Adult Psychoeducational Group Note  Date:  10/05/2012 Time:  5:13 PM  Group Topic/Focus:  Building Self Esteem:   The Focus of this group is helping patients become aware of the effects of self-esteem on their lives, the things they and others do that enhance or undermine their self-esteem, seeing the relationship between their level of self-esteem and the choices they make and learning ways to enhance self-esteem.  Participation Level:  Active  Participation Quality:  Appropriate  Affect:  Appropriate  Cognitive:  Appropriate  Insight: Appropriate  Engagement in Group:  Engaged and Supportive  Modes of Intervention:  Discussion, Socialization and Support  Additional Comments:  Pt attended group and stated two positive things about himself. Pt stated he is funny and he wants to work on staying positive and optimistic.   Jeff Browning 10/05/2012, 5:13 PM

## 2012-10-05 NOTE — Progress Notes (Signed)
Recreation Therapy Notes  Date: 09.04.2014 Time: 2:45pm Location: [address] Dayroom  Group Topic: Software engineer Activities (AAA)  Behavioral Response: Appropriate, Engaged, Attentive  Education: Coping Skill   Education Outcome: Acknowledges understanding  Clinical Observations/Feedback: Dog Team: [employer]. Patient interacted appropriately with peer, dog team, LRT and MHT.   Marykay Lex Daegan Arizmendi, LRT/CTRS  Jearl Klinefelter 10/05/2012 4:43 PM

## 2012-10-05 NOTE — Progress Notes (Signed)
[facility] Group Notes:  (Nursing/MHT/Case Management/Adjunct)  Date:  10/05/2012  Time:  2000  Type of Therapy:  Psychoeducational Skills  Participation Level:  Active  Participation Quality:  Attentive  Affect:  Excited  Cognitive:  Appropriate  Insight:  Good  Engagement in Group:  Distracting  Modes of Intervention:  Education  Summary of Progress/Problems: The patient stated in group that he felt "hyper as hell". He also stated that he slept really well last evening and didn't have any nightmares. Finally, the patient mentioned in group that he felt "positive". His goal for tomorrow is to get discharged and to continue to do well.   Hazle Coca S 10/05/2012, 9:54 PM

## 2012-10-05 NOTE — Progress Notes (Signed)
Patient ID: Jeff Browning, male   DOB: [birth year], 27 y.o.   MRN: [id number] D: Pt is awake and active on the unit this AM. Pt denies SI/HI and A/V hallucinations. Pt rates their depression at 2 and hopelessness at 4. Pt mood is depressed and his affect is flat. Pt writes that he plans to get involved with a support group after d/c and that he has realized since coming here that he cannot swap one substance for another, such as ETOH for heroine, and expect to recover from his addiction. Pt has insight and seems vested in tx goals.   A: Encouraged pt to discuss feelings with staff and administered medication per MD orders. Writer also encouraged pt to participate in groups.  R: Pt is attending groups and tolerating medications well. Writer will continue to monitor. 15 minute checks are ongoing for safety.

## 2012-10-05 NOTE — [facility] Group Notes (Signed)
[facility] LCSW Group Therapy  10/05/2012  1:15 PM   Type of Therapy:  Group Therapy  Participation Level:  Active  Participation Quality:  Appropriate and Attentive  Affect:  Appropriate and Calm  Cognitive:  Alert and Appropriate  Insight:  Developing/Improving and Engaged  Engagement in Therapy:  Developing/Improving and Engaged  Modes of Intervention:  Activity, Clarification, Confrontation, Discussion, Education, Exploration, Limit-setting, Orientation, Problem-solving, Rapport Building, Reality Testing, Socialization and Support  Summary of Progress/Problems: Patient was attentive and engaged with speaker from [employer].  Patient was attentive to speaker while they shared their story of dealing with mental health and overcoming it.  Patient expressed interest in their programs and services and received information on their agency.  Patient processed ways they can relate to the speaker.     Hari Casaus Horton, LCSWA 10/05/2012 1:34 PM   

## 2012-10-05 NOTE — Progress Notes (Signed)
Patient ID: Jeff Browning, male   DOB: [birth year], 27 y.o.   MRN: [id number] [facility] MD Progress Note  10/05/2012 2:36 PM Jeff Browning  MRN:  [id number]  Subjective: Jeff Browning was up and active in the unit milieu. He states last night was the first full night of sleep he has had since he arrived here. He reports he is feeling much better and knows this as he is eating well, sleeping well, and reports a decrease in frustration. He states he has been in 100% of the groups since his admission and is learning a great deal from them, and he feels the coping skills will help him be better able to deal with disappointment or frustration.  He feels that living with his girl friend when he discharges is going to help him significantly. He also states that being with his son, and the approaching birth of his second son will be a big motivator for improved quality of life. He rates his depression as a 3/10 and his anxiety as a 5/10.  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    ADL's:  Impaired  Sleep: improved  Appetite:  Fair  Suicidal Ideation:  Patient has suicidal gesture and behavior of cutting his wrist and sister petitioned him for IVC's. Homicidal Ideation:  Denied AEB (as evidenced by):  Psychiatric Specialty Exam: Review of Systems  Constitutional: Negative.  Negative for fever, chills, weight loss, malaise/fatigue and diaphoresis.  HENT: Negative for congestion and sore throat.   Eyes: Negative for blurred vision, double vision and photophobia.   Respiratory: Negative for cough, shortness of breath and wheezing.   Cardiovascular: Negative for chest pain, palpitations and PND.  Gastrointestinal: Negative for heartburn, nausea, vomiting, abdominal pain, diarrhea and constipation.  Musculoskeletal: Negative for myalgias, joint pain and falls.  Neurological: Negative for dizziness, tingling, tremors, sensory change, speech change, focal weakness, seizures, loss of consciousness, weakness and headaches.  Endo/Heme/Allergies: Negative for polydipsia. Does not bruise/bleed easily.  Psychiatric/Behavioral: Negative for depression, suicidal ideas, hallucinations, memory loss and substance abuse. The patient is not nervous/anxious and does not have insomnia.     Blood pressure 117/76, pulse 65, temperature 98.2 F (36.8 C), temperature source Oral, resp. rate 17, height 5\' 8"  (1.727 m), weight 62.596 kg (138 lb).Body mass index is 20.99 kg/(m^2).  General Appearance: Disheveled  Eye Contact::  Minimal  Speech:  Clear and Coherent and Normal Rate  Volume:  Decreased  Mood:  Approaching euthymic  Affect:  Brighter more positive smiling  Thought Process:  Goal Directed and Intact  Orientation:  Full (Time, Place, and Person)  Thought Content:  Obsessions and Rumination  Suicidal Thoughts:  Yes.  with intent/plan  Homicidal Thoughts:  No  Memory:  Immediate;   Fair  Judgement:  Impaired  Insight:  Lacking  Psychomotor Activity:  Decreased and Psychomotor Retardation  Concentration:  Fair  Recall:  Fair  Akathisia:  NA  Handed:  Right  AIMS (if indicated):     Assets:  Communication Skills Desire for Improvement Housing Leisure Time Physical Health Resilience Social Support Transportation  Sleep:      Current Medications: Current Facility-Administered Medications  Medication Dose Route Frequency Provider Last Rate Last Dose  . alum & mag hydroxide-simeth (MAALOX/MYLANTA) 200-200-20 MG/5ML suspension 30 mL  30 mL Oral Q4H PRN  Verne Spurr, PA-C      . cloNIDine (CATAPRES) tablet 0.1 mg  0.1 mg Oral BID Verne Spurr, PA-C   0.1 mg at 10/05/12 0810  . doxepin (SINEQUAN) capsule 50 mg  50 mg Oral QHS Nehemiah Settle, MD   50 mg at 10/04/12 2129  . FLUoxetine (PROZAC) capsule 40 mg  40 mg Oral q morning - 10a Verne Spurr, PA-C   40 mg at 10/05/12 0810  . magnesium hydroxide (MILK OF MAGNESIA) suspension 30 mL  30 mL Oral Daily PRN Verne Spurr, PA-C      . nicotine (NICODERM CQ - dosed in mg/24 hours) patch 21 mg  21 mg Transdermal Daily Nehemiah Settle, MD   21 mg at 10/05/12 4098    Lab Results:  No results found for this or any previous visit (from the past 48 hour(s)).  Physical Findings: AIMS: Facial and Oral Movements Muscles of Facial Expression: None, normal Lips and Perioral Area: None, normal Jaw: None, normal Tongue: None, normal,Extremity Movements Upper (arms, wrists, hands, fingers): None, normal Lower (legs, knees, ankles, toes): None, normal, Trunk Movements Neck, shoulders, hips: None, normal, Overall Severity Severity of abnormal movements (highest score from questions above): None, normal Incapacitation due to abnormal movements: None, normal Patient's awareness of abnormal movements (rate only patient's report): No Awareness, Dental Status Current problems with teeth and/or dentures?: No Does patient usually wear dentures?: No  CIWA:  CIWA-Ar Total: 0 COWS:  COWS Total Score: 0  Treatment Plan Summary: Daily contact with patient to assess and evaluate symptoms and progress in treatment Medication management  Plan: Continue Prozac 40 mg PO QD from [place] [facility] in [employer] Increase Sinequan 25 mg ii PO Qhs from ADS Continue Catapres 0.1 mg BID and Nicotine patch Treatment Plan/Recommendations:  1. Continue crisis management and stabilization. 2. Medication management to reduce current symptoms to base line and improve the patient's overall level of  functioning. 3. Treat health problems as indicated. 4. Develop treatment plan to decrease risk of relapse upon discharge and to reduce the need for readmission. 5. Psycho-social education regarding relapse prevention and self care. 6. Health care follow up as needed for medical problems. 7. Restart home medications where appropriate. 8. Disposition plans in progress and [facility] given appointment on 10/09/12. 9. ELOS: D/C in AM if no further concerns. Medical Decision Making Problem Points:  New problem, with no additional work-up planned (3), Review of last therapy session (1) and Review of psycho-social stressors (1) Data Points:  Review or order clinical lab tests (1) Review or order medicine tests (1) Review and summation of old records (2) Review of medication regiment & side effects (2) Review of new medications or change in dosage (2)  I certify that inpatient services furnished can reasonably be expected to improve the patient's condition.   Rona Ravens. Mashburn RPAC 3:10 PM 10/05/2012  Reviewed the information documented and agree with the treatment plan.  Graves Nipp,JANARDHAHA R. 10/06/2012 9:01 AM

## 2012-10-05 NOTE — Progress Notes (Signed)
The focus of this group is to educate the patient on the purpose and policies of crisis stabilization and provide a format to answer questions about their admission.  The group details unit policies and expectations of patients while admitted. Patient attended group and was engaged. 

## 2012-10-05 NOTE — [facility] Suicide Risk Assessment (Signed)
[facility] INPATIENT:  Family/Significant Other Suicide Prevention Education  Suicide Prevention Education:  Contact Attempts: Autumn Elton Sin - girlfriend [phone]), (name of family member/significant other) has been identified by the patient as the family member/significant other with whom the patient will be residing, and identified as the person(s) who will aid the patient in the event of a mental health crisis.  With written consent from the patient, two attempts were made to provide suicide prevention education, prior to and/or following the patient's discharge.  We were unsuccessful in providing suicide prevention education.  A suicide education pamphlet was given to the patient to share with family/significant other.  Date and time of first attempt: 10/04/12 @ 11:04 am Date and time of second attempt: 10/05/12 @ 1:06 pm  Horton, Salome Arnt 10/05/2012, 3:13 PM

## 2012-10-06 DIAGNOSIS — F1020 Alcohol dependence, uncomplicated: Secondary | ICD-10-CM

## 2012-10-06 DIAGNOSIS — F332 Major depressive disorder, recurrent severe without psychotic features: Principal | ICD-10-CM

## 2012-10-06 MED ORDER — FLUOXETINE HCL 40 MG PO CAPS: 40.0000 mg | ORAL_CAPSULE | Freq: Every morning | ORAL | Status: DC

## 2012-10-06 MED ORDER — DOXEPIN HCL 50 MG PO CAPS: 50.0000 mg | ORAL_CAPSULE | Freq: Every day | ORAL | Status: DC

## 2012-10-06 NOTE — Tx Team (Signed)
Interdisciplinary Treatment Plan Update (Adult)  Date: 10/06/2012  Time Reviewed:  9:45 AM  Progress in Treatment: Attending groups: Yes Participating in groups:  Yes Taking medication as prescribed:  Yes Tolerating medication:  Yes Family/Significant othe contact made: No, unable to reach gf Patient understands diagnosis:  Yes Discussing patient identified problems/goals with staff:  Yes Medical problems stabilized or resolved:  Yes Denies suicidal/homicidal ideation: Yes Issues/concerns per patient self-inventory:  Yes Other:  New problem(s) identified: N/A  Discharge Plan or Barriers: Pt is scheduled to go to [facility] on Monday 9/8 for further treatment  Reason for Continuation of Hospitalization: Stable to d/c today  Comments: N/A  Estimated length of stay: D/C today  For review of initial/current patient goals, please see plan of care.  Attendees: Patient:  Jeff Browning  10/06/2012 10:15 AM  Family:     Physician:  Dr. Javier Glazier 10/06/2012 9:50 AM   Nursing:   Lamount Cranker, RN 10/06/2012 9:50 AM   Clinical Social Worker:  Reyes Ivan, LCSWA 10/06/2012 9:50 AM   Other: Verne Spurr, PA 10/06/2012 9:50 AM   Other:  Frankey Shown, MA care coordination 10/06/2012 9:50 AM   Other:  Juline Patch, LCSW 10/06/2012 9:50 AM   Other:  Malissa Hippo, care coordination 10/06/2012 9:51 AM   Other:    Other:    Other:    Other:    Other:    Other:     Scribe for Treatment Team:   Carmina Miller, 10/06/2012 9:50 AM

## 2012-10-06 NOTE — Progress Notes (Signed)
Nrsg DC MD completed DC order and DC SRA. Pt denies audit, vis, tactile halluc as well as SI, HI and / or intent to hurt self or others. Pt given DC AVS and stated he understood these instructions and that he will comply. He is given DC sample meds as well as [prescriptions for contiual therapy. Pt 's belongings returned to him and he was escorted to bldg entrance and DC'd home.

## 2012-10-06 NOTE — [facility] Group Notes (Signed)
[facility] LCSW Group Therapy  10/06/2012  1:15 PM   Type of Therapy:  Group Therapy  Participation Level:  Active  Participation Quality:  Appropriate and Attentive  Affect:  Appropriate and Bright  Cognitive:  Alert and Appropriate  Insight:  Developing/Improving and Engaged  Engagement in Therapy:  Developing/Improving and Engaged  Modes of Intervention:  Clarification, Confrontation, Discussion, Education, Exploration, Limit-setting, Orientation, Problem-solving, Rapport Building, Dance movement psychotherapist, Socialization and Support  Summary of Progress/Problems: The topic for today was feelings about relapse.  Pt discussed what relapse prevention is to them and identified triggers that they are on the path to relapse.  Pt processed their feeling towards relapse and was able to relate to peers.  Pt discussed coping skills that can be used for relapse prevention.   Pt shared that this experience has made him humble and recovery for him is a new experience.  Pt shared that he has used a substance with no time of recovery since 27 years old.  Pt appeared hopeful and was receptive to the positive feedback from peers.  Pt actively participated and was engaged in group discussion, before being pulled to d/c.    Jeff Browning, LCSWA 10/06/2012 2:42 PM

## 2012-10-06 NOTE — Progress Notes (Signed)
[facility] Adult Case Management Discharge Plan :  Will you be returning to the same living situation after discharge: Yes,  returning home At discharge, do you have transportation home?:Yes,  girlfriend will pick pt up Do you have the ability to pay for your medications:Yes,  access to meds  Release of information consent forms completed and in the chart;  Patient's signature needed at discharge.  Patient to Follow up at: Follow-up Information   Follow up with [facility] On 10/09/2012. (Arrive at 8 am promptly for assessment and admission.  Bring supply of meds, ID and clothing)    Contact information:   [address] [place], [place] [address] Phone: [phone] Fax: [phone]      Patient denies SI/HI:   Yes,  denies SI/HI    Safety Planning and Suicide Prevention discussed:  Yes,  discussed with pt.  Attempted to talk to girlfriend but unable to reach her.   Carmina Miller 10/06/2012, 10:17 AM

## 2012-10-06 NOTE — [facility] Suicide Risk Assessment (Signed)
Suicide Risk Assessment  Discharge Assessment     Demographic Factors:  Male, Adolescent or young adult, Caucasian and Low socioeconomic status  Mental Status Per Nursing Assessment::   On Admission:  NA  Current Mental Status by Physician: NA  Loss Factors: Financial problems/change in socioeconomic status  Historical Factors: Impulsivity  Risk Reduction Factors:   Sense of responsibility to family, Religious beliefs about death, Living with another person, especially a relative, Positive social support, Positive therapeutic relationship and Positive coping skills or problem solving skills  Continued Clinical Symptoms:  Severe Anxiety and/or Agitation Depression:   Recent sense of peace/wellbeing Alcohol/Substance Abuse/Dependencies Medical Diagnoses and Treatments/Surgeries  Cognitive Features That Contribute To Risk:  Polarized thinking    Suicide Risk:  Minimal: No identifiable suicidal ideation.  Patients presenting with no risk factors but with morbid ruminations; may be classified as minimal risk based on the severity of the depressive symptoms  Discharge Diagnoses:   AXIS I:  Anxiety Disorder NOS, Major Depression, single episode, Substance Induced Mood Disorder and Opioid depedence and alcohol dependence AXIS II:  Deferred AXIS III:   Past Medical History  Diagnosis Date  . Depression   . Chronic abdominal pain   . Anxiety   . Tourette disease   . Hepatitis C    AXIS IV:  economic problems, occupational problems, other psychosocial or environmental problems, problems related to social environment and problems with primary support group AXIS V:  61-70 mild symptoms  Plan Of Care/Follow-up recommendations:  Activity:  as tolerated Diet:  REgular  Is patient on multiple antipsychotic therapies at discharge:  No   Has Patient had three or more failed trials of antipsychotic monotherapy by history:  No  Recommended Plan for Multiple Antipsychotic  Therapies: NA  Jeff Browning,JANARDHAHA R., MD 10/06/2012, 11:15 AM

## 2012-10-06 NOTE — Discharge Summary (Signed)
Physician Discharge Summary Note  Patient:  Jeff Browning is an 27 y.o., male MRN:  [id number] DOB:  [birth year] Patient phone:  [phone] (home)  Patient address:   [address] [place] [address],   Date of Admission:  10/03/2012 Date of Discharge: 10/06/2012   Reason for Admission:  Suicidal ideation  Discharge Diagnoses: Principal Problem:   Suicidal behavior Active Problems:   Opiate dependence   Alcohol dependence   Hepatitis C  ROS  DSM5: Substance/Addictive Disorders: Alcohol Related Disorder - Mild (305.00), Cannabis Use Disorder - Mild (305.20) and Opioid Disorder - Mild (305.50)  Depressive Disorders: 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  Depression    .  Chronic abdominal pain    .  Anxiety    .  Tourette disease    .  Hepatitis C     AXIS IV: problems related to social environment and problems with access to health care services  AXIS V: 41-50 serious symptoms   Hospital Course:  Jeff Browning was admitted when his sister petitioned him after an argument with his fiance' and her mother. The patient stated that he had been weaning himself off of heroin and was using alcohol to self medicate. He had reduced his usage to 2 beers a day, when he had had a particularly bad day and had consumed about "20 beers". He had taken his Sinequan and gone to bed. He woke up to his fiance' and her mother accusing him of stealing 2 tramadol from a neighbor upstairs. He stated that he does not remember what happened after that.      Upon evaluation at the ED his self inflicted knife wounds to his left wrist were stapled and glued together. He was admitted for stabilization and safety.      Upon arrival at the unit Jeff Browning was evaluated and his symptoms were identified. He denied SI/HI or AVH  . He was under IVC and was cooperative with treatment. He was started on medication management and encouraged to participate in unit programming. He reported minimal symptoms each day but was active in groups and felt that hey were beneficial. His behavior was appropriate and his mood was stable.        Jeff Browning did not demonstrate any symptoms of self harm, reported no withdrawal symptoms, and his mood remained stable. He was open to treatment and motivated for recovery. He already had a bed assignment at [facility] for September the 8th. He denied SI/HI and reported no AVH. He denied any side effects from the medications and reported much improved sleep.       By the day of discharge he was ready to return to his Fiance's home and anxious to see his son. He was motivated to keep his appointment with [facility] to enter their treatment program and was optimistic about his recovery. He expressed a strong desire to change his life using his son and the impending birth of a second son as his source of motivation.   Consults:  None  Significant Diagnostic Studies:  labs: CBC, CMP, UA, UDS  Discharge Vitals:   Blood pressure 114/71, pulse 72, temperature 98 F (36.7 C), temperature source Oral, resp. rate 16, height 5\' 8"  (1.727 m), weight 62.596 kg (138 lb). Body mass index is 20.99 kg/(m^2). Lab  Results:   No results found for this or any previous visit (from the past 72 hour(s)).  Physical Findings: AIMS: Facial and Oral Movements Muscles of Facial Expression: None, normal Lips and Perioral Area: None, normal Jaw: None, normal Tongue: None, normal,Extremity Movements Upper (arms, wrists, hands, fingers): None, normal Lower (legs, knees, ankles, toes): None, normal, Trunk Movements Neck, shoulders, hips: None, normal, Overall Severity Severity of abnormal movements (highest score from questions above): None, normal Incapacitation due to abnormal movements: None, normal Patient's awareness  of abnormal movements (rate only patient's report): No Awareness, Dental Status Current problems with teeth and/or dentures?: No Does patient usually wear dentures?: No  CIWA:  CIWA-Ar Total: 2 COWS:  COWS Total Score: 1  Psychiatric Specialty Exam: See Psychiatric Specialty Exam and Suicide Risk Assessment completed by Attending Physician prior to discharge.  Discharge destination:  Home  Is patient on multiple antipsychotic therapies at discharge:  No   Has Patient had three or more failed trials of antipsychotic monotherapy by history:  No  Recommended Plan for Multiple Antipsychotic Therapies:   Discharge Orders   Future Orders Complete By Expires   Diet - low sodium heart healthy  As directed    Discharge instructions  As directed    Comments:     Take all of your medications as directed. Be sure to keep all of your follow up appointments.  If you are unable to keep your follow up appointment, call your Doctor's office to let them know, and reschedule.  Make sure that you have enough medication to last until your appointment. Be sure to get plenty of rest. Going to bed at the same time each night will help. Try to avoid sleeping during the day.  Increase your activity as tolerated. Regular exercise will help you to sleep better and improve your mental health. Eating a heart healthy diet is recommended. Try to avoid salty or fried foods. Be sure to avoid all alcohol and illegal drugs.   Increase activity slowly  As directed        Medication List    STOP taking these medications       cloNIDine 0.1 MG tablet  Commonly known as:  CATAPRES      TAKE these medications     Indication   doxepin 50 MG capsule  Commonly known as:  SINEQUAN  Take 1 capsule (50 mg total) by mouth at bedtime. For depression and insomnia   Indication:  Depression and insomnia     FLUoxetine 40 MG capsule  Commonly known as:  PROZAC  Take 1 capsule (40 mg total) by mouth every morning. For  depression.   Indication:  Depression           Follow-up Information   Follow up with [facility] On 10/09/2012. (Arrive at 8 am promptly for assessment and admission.  Bring supply of meds, ID and clothing)    Contact information:   [address] [place], [place] [address] Phone: [phone] Fax: [phone]      Follow-up recommendations:   Activities: Resume activity as tolerated. Diet: Heart healthy low sodium diet Tests: Follow up testing will be determined by your out patient provider. Comments:    Total Discharge Time:  Greater than 30 minutes.  Signed: Rona Ravens. Mashburn RPAC 11:10 AM 10/06/2012  Patient is seen for a face-to-face evaluation to perform suicide risk assessment and developed discharge plan. Case discussed with the treatment team and physician extender.Reviewed the information documented and agree  with the treatment plan.  Jeff Browning,Jeff R. 10/06/2012 6:16 PM

## 2012-10-06 NOTE — [facility] Group Notes (Signed)
[facility] LCSW Aftercare Discharge Planning Group Note   10/06/2012  8:45 AM  Participation Quality:  Alert and Appropriate   Mood/Affect:  Appropriate, Calm and Bright  Depression Rating:  2-3  Anxiety Rating:  6-7 - reports anxious to be able to be able to d/c today  Thoughts of Suicide:  Pt denies SI/HI  Will you contract for safety?   Yes  Current AVH:  Pt denies  Plan for Discharge/Comments:  Pt attended discharge planning group and actively participated in group.  CSW provided pt with today's workbook.  Pt states that he lives in [place] and will follow up at [facility] for further treatment on Monday 9/8.  CSW confirmed pt does have a bed on this date.  No further needs voiced by pt at this time.    Transportation Means: Pt has access to transportation - girlfriend will pick pt up  Supports: Names girlfriend as supportive  Reyes Ivan, LCSWA 10/06/2012 9:34 AM

## 2012-10-11 NOTE — Progress Notes (Signed)
Patient Discharge Instructions:  After Visit Summary (AVS):   Faxed to:  10/11/12 Discharge Summary Note:   Faxed to:  10/11/12 Psychiatric Admission Assessment Note:   Faxed to:  10/11/12 Suicide Risk Assessment - Discharge Assessment:   Faxed to:  10/11/12 Faxed/Sent to the Next Level Care provider:  10/11/12 Faxed to [facility] @ [phone]  Jerelene Redden, 10/11/2012, 3:28 PM

## 2012-10-12 ENCOUNTER — Encounter (HOSPITAL_BASED_OUTPATIENT_CLINIC_OR_DEPARTMENT_OTHER): Payer: Self-pay | Admitting: *Deleted

## 2012-10-12 ENCOUNTER — Emergency Department (HOSPITAL_BASED_OUTPATIENT_CLINIC_OR_DEPARTMENT_OTHER)
Admission: EM | Admit: 2012-10-12 | Discharge: 2012-10-13 | Disposition: A | Discharge status: Home / Self Care | Payer: Medicaid Other | Attending: Emergency Medicine | Admitting: Emergency Medicine

## 2012-10-12 ENCOUNTER — Emergency Department (HOSPITAL_BASED_OUTPATIENT_CLINIC_OR_DEPARTMENT_OTHER): Payer: Medicaid Other

## 2012-10-12 DIAGNOSIS — F329 Major depressive disorder, single episode, unspecified: Secondary | ICD-10-CM | POA: Insufficient documentation

## 2012-10-12 DIAGNOSIS — R059 Cough, unspecified: Secondary | ICD-10-CM | POA: Insufficient documentation

## 2012-10-12 DIAGNOSIS — Z79899 Other long term (current) drug therapy: Secondary | ICD-10-CM | POA: Insufficient documentation

## 2012-10-12 DIAGNOSIS — F411 Generalized anxiety disorder: Secondary | ICD-10-CM | POA: Insufficient documentation

## 2012-10-12 DIAGNOSIS — R52 Pain, unspecified: Secondary | ICD-10-CM | POA: Insufficient documentation

## 2012-10-12 DIAGNOSIS — F17200 Nicotine dependence, unspecified, uncomplicated: Secondary | ICD-10-CM | POA: Insufficient documentation

## 2012-10-12 DIAGNOSIS — J3489 Other specified disorders of nose and nasal sinuses: Secondary | ICD-10-CM | POA: Insufficient documentation

## 2012-10-12 DIAGNOSIS — F3289 Other specified depressive episodes: Secondary | ICD-10-CM | POA: Insufficient documentation

## 2012-10-12 DIAGNOSIS — R1013 Epigastric pain: Secondary | ICD-10-CM | POA: Insufficient documentation

## 2012-10-12 DIAGNOSIS — G8929 Other chronic pain: Secondary | ICD-10-CM | POA: Insufficient documentation

## 2012-10-12 DIAGNOSIS — R112 Nausea with vomiting, unspecified: Secondary | ICD-10-CM | POA: Insufficient documentation

## 2012-10-12 DIAGNOSIS — R0789 Other chest pain: Secondary | ICD-10-CM | POA: Insufficient documentation

## 2012-10-12 DIAGNOSIS — R05 Cough: Secondary | ICD-10-CM | POA: Insufficient documentation

## 2012-10-12 DIAGNOSIS — Z8619 Personal history of other infectious and parasitic diseases: Secondary | ICD-10-CM | POA: Insufficient documentation

## 2012-10-12 HISTORY — DX: Opioid dependence, uncomplicated: F11.20

## 2012-10-12 HISTORY — DX: Alcohol dependence, uncomplicated: F10.20

## 2012-10-12 LAB — URINALYSIS, ROUTINE W REFLEX MICROSCOPIC
Bilirubin Urine: NEGATIVE
Leukocytes, UA: NEGATIVE
Nitrite: NEGATIVE
Specific Gravity, Urine: 1.007 (ref 1.005–1.030)
pH: 6 (ref 5.0–8.0)

## 2012-10-12 LAB — CBC WITH DIFFERENTIAL/PLATELET
Basophils Absolute: 0 10*3/uL (ref 0.0–0.1)
Basophils Relative: 0 % (ref 0–1)
Eosinophils Relative: 4 % (ref 0–5)
HCT: 40.5 % (ref 39.0–52.0)
MCH: 31.6 pg (ref 26.0–34.0)
MCHC: 34.6 g/dL (ref 30.0–36.0)
MCV: 91.4 fL (ref 78.0–100.0)
Monocytes Absolute: 0.9 10*3/uL (ref 0.1–1.0)
Monocytes Relative: 9 % (ref 3–12)
RDW: 13.2 % (ref 11.5–15.5)

## 2012-10-12 MED ORDER — HYDROMORPHONE HCL PF 1 MG/ML IJ SOLN
1.0000 mg | Freq: Once | INTRAMUSCULAR | Status: AC
  Administered 2012-10-13: 1 mg via INTRAVENOUS
  Filled 2012-10-12: qty 1

## 2012-10-12 MED ORDER — SODIUM CHLORIDE 0.9 % IV SOLN: INTRAVENOUS | Status: DC

## 2012-10-12 MED ORDER — PANTOPRAZOLE SODIUM 40 MG IV SOLR: 40.0000 mg | Freq: Once | INTRAVENOUS | Status: DC

## 2012-10-12 MED ORDER — ONDANSETRON HCL 4 MG/2ML IJ SOLN
4.0000 mg | Freq: Once | INTRAMUSCULAR | Status: AC
  Administered 2012-10-13: 4 mg via INTRAVENOUS
  Filled 2012-10-12: qty 2

## 2012-10-12 MED ORDER — SODIUM CHLORIDE 0.9 % IV BOLUS (SEPSIS): 250.0000 mL | Freq: Once | INTRAVENOUS | Status: DC

## 2012-10-12 MED ORDER — SODIUM CHLORIDE 0.9 % IV BOLUS (SEPSIS)
1000.0000 mL | Freq: Once | INTRAVENOUS | Status: AC
  Administered 2012-10-13: 1000 mL via INTRAVENOUS

## 2012-10-12 NOTE — ED Provider Notes (Signed)
CSN: [id number]     Arrival date & time 10/12/12  2137 History  This chart was scribed for Jeff Jakes, MD by Blanchard Kelch, ED Scribe. The patient was seen in room MH09/MH09. Patient's care was started at 11:11 PM.    Chief Complaint  Patient presents with  . Abdominal Pain    Patient is a 27 y.o. male presenting with abdominal pain. The history is provided by the patient. No language interpreter was used.  Abdominal Pain Pain location:  Epigastric Pain quality: aching   Pain radiates to:  Groin Pain severity:  Severe Onset quality:  Sudden Duration:  2 hours Timing:  Constant Chronicity:  Recurrent Associated symptoms: chest pain, cough, nausea and vomiting   Associated symptoms: no chills, no dysuria, no fever, no shortness of breath and no sore throat     HPI Comments: Jeff Browning is a 27 y.o. male who presents to the Emergency Department complaining of constant epigastric abdominal pain that began about two hours ago. He describes the pain as sharp and a 10/10 on severity. The pain radiates down to both sides of his groin. He complains of nausea, vomiting, cough, chest pain (began this afternoon), congestion. He has had prior episodes, with the most recent being a few weeks ago (8/19). He was supposed to see a specialist for his pain a month and a half ago but didn't go. He denies diarrhea, dysuria, fever, chills, visual changes,sore throat, rhinorrhea, shortness of breath, ankle swelling, rash, history of bleeding easily, neck pain, back pain, headache.      Past Medical History  Diagnosis Date  . Depression   . Chronic abdominal pain   . Anxiety   . Tourette disease   . Hepatitis C    History reviewed. No pertinent past surgical history. Family History  Problem Relation Age of Onset  . Ulcerative colitis Father    History  Substance Use Topics  . Smoking status: Current Every Day Smoker -- 1.00 packs/day  . Smokeless tobacco: Not on file  . Alcohol Use:  Yes     Comment: Daily     Review of Systems  Constitutional: Negative for fever and chills.  HENT: Positive for congestion. Negative for sore throat, rhinorrhea and neck pain.   Eyes: Negative for visual disturbance.  Respiratory: Positive for cough. Negative for shortness of breath.   Cardiovascular: Positive for chest pain. Negative for leg swelling.  Gastrointestinal: Positive for nausea, vomiting and abdominal pain.  Genitourinary: Negative for dysuria.  Musculoskeletal: Negative for back pain.  Skin: Negative for rash.  Neurological: Negative for headaches.  Hematological: Does not bruise/bleed easily.  Psychiatric/Behavioral: Negative for confusion.  All other systems reviewed and are negative.    Allergies  Acetaminophen  Home Medications   Current Outpatient Rx  Name  Route  Sig  Dispense  Refill  . doxepin (SINEQUAN) 50 MG capsule   Oral   Take 1 capsule (50 mg total) by mouth at bedtime. For depression.   30 capsule   0   . FLUoxetine (PROZAC) 40 MG capsule   Oral   Take 1 capsule (40 mg total) by mouth every morning. For depression.   30 capsule   0   . promethazine (PHENERGAN) 25 MG tablet   Oral   Take 1 tablet (25 mg total) by mouth every 6 (six) hours as needed for nausea.   12 tablet   0   . traMADol (ULTRAM) 50 MG tablet   Oral  Take 1 tablet (50 mg total) by mouth every 6 (six) hours as needed for pain.   10 tablet   0    Triage Vitals: BP 120/72  Pulse 108  Temp(Src) 98.6 F (37 C) (Oral)  Resp 20  Ht 5\' 9"  (1.753 m)  Wt 140 lb (63.504 kg)  BMI 20.67 kg/m2  SpO2 98%  Physical Exam  Nursing note and vitals reviewed. Constitutional: He is oriented to person, place, and time. He appears well-developed and well-nourished.  HENT:  Head: Normocephalic and atraumatic.  Moist mucous membranes.  Eyes: Conjunctivae and EOM are normal. Pupils are equal, round, and reactive to light.  Cardiovascular: Normal rate and regular rhythm.   No  murmur heard. Pulmonary/Chest: Effort normal and breath sounds normal. No respiratory distress.  Abdominal: Soft. Bowel sounds are normal. He exhibits no distension. There is tenderness (epigastric area). There is no guarding.  Neurological: He is alert and oriented to person, place, and time.    ED Course  Procedures (including critical care time)  DIAGNOSTIC STUDIES: Oxygen Saturation is 98% on room air, normal by my interpretation.    COORDINATION OF CARE:  11:11 PM - Patient verbalizes understanding and agrees with treatment plan.  Results for orders placed during the hospital encounter of 10/12/12  URINALYSIS, ROUTINE W REFLEX MICROSCOPIC      Result Value Range   Color, Urine YELLOW  YELLOW   APPearance CLEAR  CLEAR   Specific Gravity, Urine 1.007  1.005 - 1.030   pH 6.0  5.0 - 8.0   Glucose, UA NEGATIVE  NEGATIVE mg/dL   Hgb urine dipstick NEGATIVE  NEGATIVE   Bilirubin Urine NEGATIVE  NEGATIVE   Ketones, ur NEGATIVE  NEGATIVE mg/dL   Protein, ur NEGATIVE  NEGATIVE mg/dL   Urobilinogen, UA 0.2  0.0 - 1.0 mg/dL   Nitrite NEGATIVE  NEGATIVE   Leukocytes, UA NEGATIVE  NEGATIVE  COMPREHENSIVE METABOLIC PANEL      Result Value Range   Sodium 139  135 - 145 mEq/L   Potassium 3.5  3.5 - 5.1 mEq/L   Chloride 103  96 - 112 mEq/L   CO2 27  19 - 32 mEq/L   Glucose, Bld 100 (*) 70 - 99 mg/dL   BUN 8  6 - 23 mg/dL   Creatinine, Ser 8.11  0.50 - 1.35 mg/dL   Calcium 9.2  8.4 - 91.4 mg/dL   Total Protein 7.0  6.0 - 8.3 g/dL   Albumin 3.9  3.5 - 5.2 g/dL   AST 782 (*) 0 - 37 U/L   ALT 782 (*) 0 - 53 U/L   Alkaline Phosphatase 104  39 - 117 U/L   Total Bilirubin 0.2 (*) 0.3 - 1.2 mg/dL   GFR calc non Af Amer >90  >90 mL/min   GFR calc Af Amer >90  >90 mL/min  LIPASE, BLOOD      Result Value Range   Lipase 24  11 - 59 U/L  CBC WITH DIFFERENTIAL      Result Value Range   WBC 9.8  4.0 - 10.5 K/uL   RBC 4.43  4.22 - 5.81 MIL/uL   Hemoglobin 14.0  13.0 - 17.0 g/dL   HCT 95.6   21.3 - 08.6 %   MCV 91.4  78.0 - 100.0 fL   MCH 31.6  26.0 - 34.0 pg   MCHC 34.6  30.0 - 36.0 g/dL   RDW 57.8  46.9 - 62.9 %   Platelets 312  150 - 400 K/uL   Neutrophils Relative % 63  43 - 77 %   Neutro Abs 6.2  1.7 - 7.7 K/uL   Lymphocytes Relative 25  12 - 46 %   Lymphs Abs 2.4  0.7 - 4.0 K/uL   Monocytes Relative 9  3 - 12 %   Monocytes Absolute 0.9  0.1 - 1.0 K/uL   Eosinophils Relative 4  0 - 5 %   Eosinophils Absolute 0.4  0.0 - 0.7 K/uL   Basophils Relative 0  0 - 1 %   Basophils Absolute 0.0  0.0 - 0.1 K/uL   Results for orders placed during the hospital encounter of 10/12/12  URINALYSIS, ROUTINE W REFLEX MICROSCOPIC      Result Value Range   Color, Urine YELLOW  YELLOW   APPearance CLEAR  CLEAR   Specific Gravity, Urine 1.007  1.005 - 1.030   pH 6.0  5.0 - 8.0   Glucose, UA NEGATIVE  NEGATIVE mg/dL   Hgb urine dipstick NEGATIVE  NEGATIVE   Bilirubin Urine NEGATIVE  NEGATIVE   Ketones, ur NEGATIVE  NEGATIVE mg/dL   Protein, ur NEGATIVE  NEGATIVE mg/dL   Urobilinogen, UA 0.2  0.0 - 1.0 mg/dL   Nitrite NEGATIVE  NEGATIVE   Leukocytes, UA NEGATIVE  NEGATIVE  COMPREHENSIVE METABOLIC PANEL      Result Value Range   Sodium 139  135 - 145 mEq/L   Potassium 3.5  3.5 - 5.1 mEq/L   Chloride 103  96 - 112 mEq/L   CO2 27  19 - 32 mEq/L   Glucose, Bld 100 (*) 70 - 99 mg/dL   BUN 8  6 - 23 mg/dL   Creatinine, Ser 1.61  0.50 - 1.35 mg/dL   Calcium 9.2  8.4 - 09.6 mg/dL   Total Protein 7.0  6.0 - 8.3 g/dL   Albumin 3.9  3.5 - 5.2 g/dL   AST 045 (*) 0 - 37 U/L   ALT 782 (*) 0 - 53 U/L   Alkaline Phosphatase 104  39 - 117 U/L   Total Bilirubin 0.2 (*) 0.3 - 1.2 mg/dL   GFR calc non Af Amer >90  >90 mL/min   GFR calc Af Amer >90  >90 mL/min  LIPASE, BLOOD      Result Value Range   Lipase 24  11 - 59 U/L  CBC WITH DIFFERENTIAL      Result Value Range   WBC 9.8  4.0 - 10.5 K/uL   RBC 4.43  4.22 - 5.81 MIL/uL   Hemoglobin 14.0  13.0 - 17.0 g/dL   HCT 40.9  81.1 - 91.4 %    MCV 91.4  78.0 - 100.0 fL   MCH 31.6  26.0 - 34.0 pg   MCHC 34.6  30.0 - 36.0 g/dL   RDW 78.2  95.6 - 21.3 %   Platelets 312  150 - 400 K/uL   Neutrophils Relative % 63  43 - 77 %   Neutro Abs 6.2  1.7 - 7.7 K/uL   Lymphocytes Relative 25  12 - 46 %   Lymphs Abs 2.4  0.7 - 4.0 K/uL   Monocytes Relative 9  3 - 12 %   Monocytes Absolute 0.9  0.1 - 1.0 K/uL   Eosinophils Relative 4  0 - 5 %   Eosinophils Absolute 0.4  0.0 - 0.7 K/uL   Basophils Relative 0  0 - 1 %   Basophils Absolute 0.0  0.0 -  0.1 K/uL      Dg Abd Acute W/chest  09/19/2012   *RADIOLOGY REPORT*  Clinical Data: Epigastric pain.  Vomiting.  ACUTE ABDOMEN SERIES (ABDOMEN 2 VIEW & CHEST 1 VIEW)  Comparison: Radiographs dated 05/09/2010  Findings: The heart size and pulmonary vascularity are normal and the lungs are clear.  No free air free fluid in the abdomen.  The bowel gas pattern is normal.  No abnormal abdominal calcifications. No osseous abnormality.  IMPRESSION: Normal exam.   Original Report Authenticated By: Francene Boyers, M.D.    Date: 10/13/2012  Rate: 86  Rhythm: normal sinus rhythm  QRS Axis: right  Intervals: normal  ST/T Wave abnormalities: normal  Conduction Disutrbances:none  Narrative Interpretation:   Old EKG Reviewed: none available    Medications  0.9 %  sodium chloride infusion (not administered)  0.9 %  sodium chloride infusion (not administered)  ondansetron (ZOFRAN) injection 4 mg (4 mg Intravenous Given 10/13/12 0006)  HYDROmorphone (DILAUDID) injection 1 mg (1 mg Intravenous Given 10/13/12 0006)  sodium chloride 0.9 % bolus 1,000 mL (0 mLs Intravenous Stopped 10/13/12 0106)  HYDROmorphone (DILAUDID) injection 1 mg (1 mg Intravenous Given 10/13/12 0042)      MDM   1. Abdominal pain, acute, epigastric    Workup without any specific findings other than the hepatitis which she's had in the past. Patient will need followup with GI medicine. No acute abnormality no evidence of  pancreatitis bilirubin is normal evidence of bowel obstruction no evidence of free air. No leukocytosis. Patient improved in the emergency part with pain medicine.    I personally performed the services described in this documentation, which was scribed in my presence. The recorded information has been reviewed and is accurate.     Jeff Jakes, MD 10/13/12 [phone]

## 2012-10-12 NOTE — ED Notes (Signed)
MD at bedside. 

## 2012-10-12 NOTE — ED Notes (Signed)
Abdominal pain x 1 hour. Mid abdomen pain with radiation into his groin.

## 2012-10-13 ENCOUNTER — Encounter (HOSPITAL_BASED_OUTPATIENT_CLINIC_OR_DEPARTMENT_OTHER): Payer: Self-pay | Admitting: Emergency Medicine

## 2012-10-13 LAB — COMPREHENSIVE METABOLIC PANEL
ALT: 782 U/L — ABNORMAL HIGH (ref 0–53)
AST: 290 U/L — ABNORMAL HIGH (ref 0–37)
Albumin: 3.9 g/dL (ref 3.5–5.2)
Alkaline Phosphatase: 104 U/L (ref 39–117)
BUN: 8 mg/dL (ref 6–23)
Chloride: 103 mEq/L (ref 96–112)
Potassium: 3.5 mEq/L (ref 3.5–5.1)
Sodium: 139 mEq/L (ref 135–145)
Total Bilirubin: 0.2 mg/dL — ABNORMAL LOW (ref 0.3–1.2)

## 2012-10-13 MED ORDER — TRAMADOL HCL 50 MG PO TABS: 50.0000 mg | ORAL_TABLET | Freq: Four times a day (QID) | ORAL | Status: DC | PRN

## 2012-10-13 MED ORDER — HYDROMORPHONE HCL PF 1 MG/ML IJ SOLN
1.0000 mg | Freq: Once | INTRAMUSCULAR | Status: AC
  Administered 2012-10-13: 1 mg via INTRAVENOUS

## 2012-10-13 MED ORDER — PROMETHAZINE HCL 25 MG PO TABS: 25.0000 mg | ORAL_TABLET | Freq: Four times a day (QID) | ORAL | Status: DC | PRN

## 2012-10-13 MED ORDER — HYDROMORPHONE HCL PF 1 MG/ML IJ SOLN
INTRAMUSCULAR | Status: AC
  Administered 2012-10-13: 1 mg via INTRAVENOUS
  Filled 2012-10-13: qty 1

## 2012-10-13 NOTE — ED Notes (Signed)
Pt. Is in no distress with friend at bedside.

## 2012-10-13 NOTE — ED Notes (Signed)
Pt. Reporting his pain has increased since RN gave him Dilaudid and Zofran.  Dr. (EDP ) made aware.

## 2012-10-13 NOTE — ED Notes (Signed)
MD at bedside. 

## 2012-10-13 NOTE — ED Notes (Signed)
Pt. To Radiology

## 2012-11-01 ENCOUNTER — Emergency Department (HOSPITAL_COMMUNITY)
Admission: EM | Admit: 2012-11-01 | Discharge: 2012-11-02 | Disposition: A | Discharge status: Home / Self Care | Payer: Medicaid Other | Attending: Emergency Medicine | Admitting: Emergency Medicine

## 2012-11-01 DIAGNOSIS — F411 Generalized anxiety disorder: Secondary | ICD-10-CM | POA: Insufficient documentation

## 2012-11-01 DIAGNOSIS — G8929 Other chronic pain: Secondary | ICD-10-CM | POA: Insufficient documentation

## 2012-11-01 DIAGNOSIS — W208XXA Other cause of strike by thrown, projected or falling object, initial encounter: Secondary | ICD-10-CM | POA: Insufficient documentation

## 2012-11-01 DIAGNOSIS — R109 Unspecified abdominal pain: Secondary | ICD-10-CM

## 2012-11-01 DIAGNOSIS — F3289 Other specified depressive episodes: Secondary | ICD-10-CM | POA: Insufficient documentation

## 2012-11-01 DIAGNOSIS — R112 Nausea with vomiting, unspecified: Secondary | ICD-10-CM | POA: Insufficient documentation

## 2012-11-01 DIAGNOSIS — Y939 Activity, unspecified: Secondary | ICD-10-CM | POA: Insufficient documentation

## 2012-11-01 DIAGNOSIS — R1084 Generalized abdominal pain: Secondary | ICD-10-CM | POA: Insufficient documentation

## 2012-11-01 DIAGNOSIS — F1021 Alcohol dependence, in remission: Secondary | ICD-10-CM | POA: Insufficient documentation

## 2012-11-01 DIAGNOSIS — Z8619 Personal history of other infectious and parasitic diseases: Secondary | ICD-10-CM | POA: Insufficient documentation

## 2012-11-01 DIAGNOSIS — F329 Major depressive disorder, single episode, unspecified: Secondary | ICD-10-CM | POA: Insufficient documentation

## 2012-11-01 DIAGNOSIS — S51809A Unspecified open wound of unspecified forearm, initial encounter: Secondary | ICD-10-CM | POA: Insufficient documentation

## 2012-11-01 DIAGNOSIS — S41112A Laceration without foreign body of left upper arm, initial encounter: Secondary | ICD-10-CM

## 2012-11-01 DIAGNOSIS — F17200 Nicotine dependence, unspecified, uncomplicated: Secondary | ICD-10-CM | POA: Insufficient documentation

## 2012-11-01 DIAGNOSIS — Y929 Unspecified place or not applicable: Secondary | ICD-10-CM | POA: Insufficient documentation

## 2012-11-02 ENCOUNTER — Encounter (HOSPITAL_COMMUNITY): Payer: Self-pay | Admitting: Emergency Medicine

## 2012-11-02 ENCOUNTER — Emergency Department (HOSPITAL_COMMUNITY): Payer: Medicaid Other

## 2012-11-02 LAB — CBC WITH DIFFERENTIAL/PLATELET
Eosinophils Relative: 2 % (ref 0–5)
HCT: 42.1 % (ref 39.0–52.0)
Lymphocytes Relative: 26 % (ref 12–46)
Lymphs Abs: 3 10*3/uL (ref 0.7–4.0)
Monocytes Relative: 6 % (ref 3–12)
Platelets: 360 10*3/uL (ref 150–400)

## 2012-11-02 LAB — COMPREHENSIVE METABOLIC PANEL
ALT: 140 U/L — ABNORMAL HIGH (ref 0–53)
Alkaline Phosphatase: 113 U/L (ref 39–117)
CO2: 24 mEq/L (ref 19–32)
Calcium: 9 mg/dL (ref 8.4–10.5)
GFR calc Af Amer: >90 mL/min (ref >90)
GFR calc non Af Amer: >90 mL/min (ref >90)
Glucose, Bld: 90 mg/dL (ref 70–99)
Potassium: 4 mEq/L (ref 3.5–5.1)
Sodium: 137 mEq/L (ref 135–145)

## 2012-11-02 LAB — URINALYSIS, ROUTINE W REFLEX MICROSCOPIC
Bilirubin Urine: NEGATIVE
Hgb urine dipstick: NEGATIVE
Protein, ur: NEGATIVE mg/dL
Urobilinogen, UA: 0.2 mg/dL (ref 0.0–1.0)

## 2012-11-02 MED ORDER — HYDROMORPHONE HCL PF 1 MG/ML IJ SOLN
1.0000 mg | Freq: Once | INTRAMUSCULAR | Status: AC
  Administered 2012-11-02: 1 mg via INTRAVENOUS
  Filled 2012-11-02: qty 1

## 2012-11-02 MED ORDER — SODIUM CHLORIDE 0.9 % IV SOLN
Freq: Once | INTRAVENOUS | Status: AC
  Administered 2012-11-02: 01:00:00 via INTRAVENOUS

## 2012-11-02 MED ORDER — OXYCODONE HCL 5 MG PO TABS: 5.0000 mg | ORAL_TABLET | ORAL | Status: DC | PRN

## 2012-11-02 MED ORDER — HYDROMORPHONE HCL PF 2 MG/ML IJ SOLN
2.0000 mg | Freq: Once | INTRAMUSCULAR | Status: AC
  Administered 2012-11-02: 2 mg via INTRAVENOUS
  Filled 2012-11-02: qty 1

## 2012-11-02 MED ORDER — ONDANSETRON HCL 4 MG/2ML IJ SOLN
4.0000 mg | Freq: Once | INTRAMUSCULAR | Status: AC
  Administered 2012-11-02: 4 mg via INTRAVENOUS
  Filled 2012-11-02: qty 2
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
## 2012-11-02 NOTE — ED Notes (Signed)
Lacerations noted to left arm, with gauze.
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
## 2012-11-03 NOTE — ED Provider Notes (Signed)
Medical screening examination/treatment/procedure(s) were performed by non-physician practitioner and as supervising physician I was immediately available for consultation/collaboration.  Jaiona Simien, MD 11/03/12 0432 

## 2013-01-01 ENCOUNTER — Encounter (HOSPITAL_BASED_OUTPATIENT_CLINIC_OR_DEPARTMENT_OTHER): Payer: Self-pay | Admitting: Emergency Medicine

## 2013-01-01 ENCOUNTER — Emergency Department (HOSPITAL_BASED_OUTPATIENT_CLINIC_OR_DEPARTMENT_OTHER)
Admission: EM | Admit: 2013-01-01 | Discharge: 2013-01-02 | Disposition: A | Discharge status: Home / Self Care | Payer: Medicaid Other | Attending: Emergency Medicine | Admitting: Emergency Medicine

## 2013-01-01 DIAGNOSIS — Z8619 Personal history of other infectious and parasitic diseases: Secondary | ICD-10-CM | POA: Insufficient documentation

## 2013-01-01 DIAGNOSIS — Z79899 Other long term (current) drug therapy: Secondary | ICD-10-CM | POA: Insufficient documentation

## 2013-01-01 DIAGNOSIS — F17200 Nicotine dependence, unspecified, uncomplicated: Secondary | ICD-10-CM | POA: Insufficient documentation

## 2013-01-01 DIAGNOSIS — R109 Unspecified abdominal pain: Secondary | ICD-10-CM

## 2013-01-01 DIAGNOSIS — R1084 Generalized abdominal pain: Secondary | ICD-10-CM | POA: Insufficient documentation

## 2013-01-01 DIAGNOSIS — R112 Nausea with vomiting, unspecified: Secondary | ICD-10-CM | POA: Insufficient documentation

## 2013-01-01 DIAGNOSIS — F329 Major depressive disorder, single episode, unspecified: Secondary | ICD-10-CM | POA: Insufficient documentation

## 2013-01-01 DIAGNOSIS — F411 Generalized anxiety disorder: Secondary | ICD-10-CM | POA: Insufficient documentation

## 2013-01-01 DIAGNOSIS — F3289 Other specified depressive episodes: Secondary | ICD-10-CM | POA: Insufficient documentation

## 2013-01-01 DIAGNOSIS — G8929 Other chronic pain: Secondary | ICD-10-CM | POA: Insufficient documentation

## 2013-01-01 LAB — CBC WITH DIFFERENTIAL/PLATELET
Basophils Relative: 1 % (ref 0–1)
Eosinophils Relative: 3 % (ref 0–5)
HCT: 45.1 % (ref 39.0–52.0)
Hemoglobin: 16 g/dL (ref 13.0–17.0)
Lymphs Abs: 3.1 10*3/uL (ref 0.7–4.0)
MCH: 31.2 pg (ref 26.0–34.0)
MCHC: 35.5 g/dL (ref 30.0–36.0)
Monocytes Absolute: 0.9 10*3/uL (ref 0.1–1.0)
Monocytes Relative: 7 % (ref 3–12)
Neutro Abs: 8 10*3/uL — ABNORMAL HIGH (ref 1.7–7.7)
RBC: 5.13 MIL/uL (ref 4.22–5.81)
RDW: 12.3 % (ref 11.5–15.5)

## 2013-01-01 LAB — COMPREHENSIVE METABOLIC PANEL
Albumin: 4.5 g/dL (ref 3.5–5.2)
Alkaline Phosphatase: 99 U/L (ref 39–117)
BUN: 8 mg/dL (ref 6–23)
Calcium: 9.7 mg/dL (ref 8.4–10.5)
Chloride: 101 mEq/L (ref 96–112)
Creatinine, Ser: 0.8 mg/dL (ref 0.50–1.35)
GFR calc Af Amer: >90 mL/min (ref >90)
GFR calc non Af Amer: >90 mL/min (ref >90)
Glucose, Bld: 92 mg/dL (ref 70–99)
Potassium: 3.8 mEq/L (ref 3.5–5.1)
Total Bilirubin: 0.2 mg/dL — ABNORMAL LOW (ref 0.3–1.2)

## 2013-01-01 LAB — LIPASE, BLOOD: Lipase: 21 U/L (ref 11–59)

## 2013-01-01 MED ORDER — PROMETHAZINE HCL 25 MG/ML IJ SOLN
12.5000 mg | Freq: Once | INTRAMUSCULAR | Status: AC
  Administered 2013-01-01: 12.5 mg via INTRAVENOUS
  Filled 2013-01-01: qty 1

## 2013-01-01 MED ORDER — GI COCKTAIL ~~LOC~~
30.0000 mL | Freq: Once | ORAL | Status: AC
  Administered 2013-01-01: 30 mL via ORAL
  Filled 2013-01-01: qty 30

## 2013-01-01 MED ORDER — HYDROMORPHONE HCL PF 1 MG/ML IJ SOLN
1.0000 mg | Freq: Once | INTRAMUSCULAR | Status: AC
  Administered 2013-01-02: 1 mg via INTRAVENOUS
  Filled 2013-01-01: qty 1

## 2013-01-01 MED ORDER — SODIUM CHLORIDE 0.9 % IV SOLN
INTRAVENOUS | Status: AC
  Administered 2013-01-01: 22:00:00 via INTRAVENOUS

## 2013-01-01 MED ORDER — ONDANSETRON 8 MG PO TBDP
8.0000 mg | ORAL_TABLET | Freq: Once | ORAL | Status: AC
  Administered 2013-01-01: 8 mg via ORAL

## 2013-01-01 MED ORDER — HYDROMORPHONE HCL PF 1 MG/ML IJ SOLN
1.0000 mg | Freq: Once | INTRAMUSCULAR | Status: AC
  Administered 2013-01-01: 1 mg via INTRAVENOUS
  Filled 2013-01-01: qty 1

## 2013-01-01 MED ORDER — ONDANSETRON 8 MG PO TBDP
ORAL_TABLET | ORAL | Status: AC
  Filled 2013-01-01: qty 1

## 2013-01-01 NOTE — ED Notes (Signed)
Per pt he has "ongoing stomach problems". Reports he drank 3 beers "back to back" tonight at 5pm. Pain started around 1845. Vomited x 2

## 2013-01-01 NOTE — ED Notes (Signed)
Patient aware of needing a urine specimen
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
## 2013-01-01 NOTE — ED Provider Notes (Signed)
CSN: [id number]     Arrival date & time 01/01/13  2003 History   First MD Initiated Contact with Patient 01/01/13 2119     Chief Complaint  Patient presents with  . Abdominal Pain   (Consider location/radiation/quality/duration/timing/severity/associated sxs/prior Treatment) Patient is a 27 y.o. male presenting with abdominal pain. The history is provided by the patient.  Abdominal Pain Pain location:  Generalized Pain quality: aching, burning and sharp   Pain radiates to:  Does not radiate Pain severity:  Severe Onset quality:  Sudden Duration:  4 hours Timing:  Constant Progression:  Worsening Chronicity:  Chronic Context: alcohol use   Relieved by:  None tried Worsened by:  Nothing tried Ineffective treatments:  None tried Associated symptoms: nausea and vomiting   Associated symptoms: no chest pain, no chills, no dysuria, no fever and no shortness of breath   Risk factors: alcohol abuse    Jeff Browning is a 27 y.o. male who presents to the ED with abdominal pain. He has a history of chronic abdominal pain, ETOH abuse and Hepatitis C. He states that tonight he drank 3 beers one right after the other and immediately started having abdominal pain.  Past Medical History  Diagnosis Date  . Depression   . Chronic abdominal pain   . Anxiety   . Tourette disease   . Hepatitis C   . Alcohol dependence   . Opiate dependence    History reviewed. No pertinent past surgical history. Family History  Problem Relation Age of Onset  . Ulcerative colitis Father    History  Substance Use Topics  . Smoking status: Current Every Day Smoker -- 1.00 packs/day    Types: Cigarettes  . Smokeless tobacco: Not on file  . Alcohol Use: 7.2 oz/week    12 Cans of beer per week     Comment: Daily     Review of Systems  Constitutional: Negative for fever and chills.  HENT: Negative.   Eyes: Negative for visual disturbance.  Respiratory: Negative for chest tightness and shortness of  breath.   Cardiovascular: Negative for chest pain.  Gastrointestinal: Positive for nausea, vomiting and abdominal pain.  Genitourinary: Negative for dysuria, urgency and frequency.  Musculoskeletal: Negative for back pain.  Skin: Negative for rash.  Neurological: Negative for light-headedness and headaches.  Psychiatric/Behavioral: The patient is not nervous/anxious.     Allergies  Acetaminophen  Home Medications   Current Outpatient Rx  Name  Route  Sig  Dispense  Refill  . desvenlafaxine (PRISTIQ) 50 MG 24 hr tablet   Oral   Take 50 mg by mouth daily. Waiting for refill         . traZODone (DESYREL) 50 MG tablet   Oral   Take 50 mg by mouth at bedtime.         Marland Kitchen doxepin (SINEQUAN) 50 MG capsule   Oral   Take 1 capsule (50 mg total) by mouth at bedtime. For depression.   30 capsule   0   . FLUoxetine (PROZAC) 40 MG capsule   Oral   Take 1 capsule (40 mg total) by mouth every morning. For depression.   30 capsule   0   . oxyCODONE (ROXICODONE) 5 MG immediate release tablet   Oral   Take 1 tablet (5 mg total) by mouth every 4 (four) hours as needed for pain.   5 tablet   0    BP 104/55  Pulse 86  Temp(Src) 98.8 F (37.1 C) (Oral)  Resp 18  Ht 5\' 9"  (1.753 m)  Wt 145 lb (65.772 kg)  BMI 21.40 kg/m2  SpO2 98% Physical Exam  Nursing note and vitals reviewed. Constitutional: He is oriented to person, place, and time. He appears well-developed and well-nourished. No distress.  Eyes: EOM are normal.  Neck: Neck supple.  Cardiovascular: Normal rate.   Pulmonary/Chest: Effort normal.  Abdominal: Soft. There is generalized tenderness. There is no rebound, no guarding and no CVA tenderness.  Musculoskeletal: Normal range of motion.  Neurological: He is alert and oriented to person, place, and time. No cranial nerve deficit.  Skin: Skin is warm and dry.  Psychiatric: He has a normal mood and affect. His behavior is normal.    ED Course  ProceduresImaging  Review No results found.  EKG Interpretation   None      Results for orders placed during the hospital encounter of 01/01/13 (from the past 24 hour(s))  CBC WITH DIFFERENTIAL     Status: Abnormal   Collection Time    01/01/13  8:50 PM      Result Value Range   WBC 12.3 (*) 4.0 - 10.5 K/uL   RBC 5.13  4.22 - 5.81 MIL/uL   Hemoglobin 16.0  13.0 - 17.0 g/dL   HCT 16.1  09.6 - 04.5 %   MCV 87.9  78.0 - 100.0 fL   MCH 31.2  26.0 - 34.0 pg   MCHC 35.5  30.0 - 36.0 g/dL   RDW 40.9  81.1 - 91.4 %   Platelets 365  150 - 400 K/uL   Neutrophils Relative % 65  43 - 77 %   Neutro Abs 8.0 (*) 1.7 - 7.7 K/uL   Lymphocytes Relative 25  12 - 46 %   Lymphs Abs 3.1  0.7 - 4.0 K/uL   Monocytes Relative 7  3 - 12 %   Monocytes Absolute 0.9  0.1 - 1.0 K/uL   Eosinophils Relative 3  0 - 5 %   Eosinophils Absolute 0.3  0.0 - 0.7 K/uL   Basophils Relative 1  0 - 1 %   Basophils Absolute 0.1  0.0 - 0.1 K/uL  COMPREHENSIVE METABOLIC PANEL     Status: Abnormal   Collection Time    01/01/13  8:50 PM      Result Value Range   Sodium 139  135 - 145 mEq/L   Potassium 3.8  3.5 - 5.1 mEq/L   Chloride 101  96 - 112 mEq/L   CO2 24  19 - 32 mEq/L   Glucose, Bld 92  70 - 99 mg/dL   BUN 8  6 - 23 mg/dL   Creatinine, Ser 7.82  0.50 - 1.35 mg/dL   Calcium 9.7  8.4 - 95.6 mg/dL   Total Protein 8.6 (*) 6.0 - 8.3 g/dL   Albumin 4.5  3.5 - 5.2 g/dL   AST 20  0 - 37 U/L   ALT 45  0 - 53 U/L   Alkaline Phosphatase 99  39 - 117 U/L   Total Bilirubin 0.2 (*) 0.3 - 1.2 mg/dL   GFR calc non Af Amer >90  >90 mL/min   GFR calc Af Amer >90  >90 mL/min  LIPASE, BLOOD     Status: None   Collection Time    01/01/13  8:50 PM      Result Value Range   Lipase 21  11 - 59 U/L  URINALYSIS, ROUTINE W REFLEX MICROSCOPIC     Status:  None   Collection Time    01/02/13  1:20 AM      Result Value Range   Color, Urine YELLOW  YELLOW   APPearance CLEAR  CLEAR   Specific Gravity, Urine 1.015  1.005 - 1.030   pH 6.0  5.0 -  8.0   Glucose, UA NEGATIVE  NEGATIVE mg/dL   Hgb urine dipstick NEGATIVE  NEGATIVE   Bilirubin Urine NEGATIVE  NEGATIVE   Ketones, ur NEGATIVE  NEGATIVE mg/dL   Protein, ur NEGATIVE  NEGATIVE mg/dL   Urobilinogen, UA 0.2  0.0 - 1.0 mg/dL   Nitrite NEGATIVE  NEGATIVE   Leukocytes, UA NEGATIVE  NEGATIVE    MDM   1. Abdominal pain    27 y.o. male with abdomina pain after drinking 3 beers. History of chronic abdominal pain, ETOH dependency and Hepatitis C. Treated with IV hydration, Zofran, Phenergan and Dilaudid. Patient feeling much better and states he is ready to go home. Stable for discharge without any immediate complications.  Discussed with the patient and all questioned fully answered. He will return if any problems arise.    Medication List    ASK your doctor about these medications       desvenlafaxine 50 MG 24 hr tablet  Commonly known as:  PRISTIQ  Take 50 mg by mouth daily. Waiting for refill     doxepin 50 MG capsule  Commonly known as:  SINEQUAN  Take 1 capsule (50 mg total) by mouth at bedtime. For depression.     FLUoxetine 40 MG capsule  Commonly known as:  PROZAC  Take 1 capsule (40 mg total) by mouth every morning. For depression.     oxyCODONE 5 MG immediate release tablet  Commonly known as:  ROXICODONE  Take 1 tablet (5 mg total) by mouth every 4 (four) hours as needed for pain.     traZODone 50 MG tablet  Commonly known as:  DESYREL  Take 50 mg by mouth at bedtime.          [facility] Orlene Och, [place] 01/02/13 [phone]

## 2013-01-02 LAB — URINALYSIS, ROUTINE W REFLEX MICROSCOPIC
Bilirubin Urine: NEGATIVE
Glucose, UA: NEGATIVE mg/dL
Hgb urine dipstick: NEGATIVE
Ketones, ur: NEGATIVE mg/dL
Leukocytes, UA: NEGATIVE
Protein, ur: NEGATIVE mg/dL
Specific Gravity, Urine: 1.015 (ref 1.005–1.030)
pH: 6 (ref 5.0–8.0)

## 2013-01-02 NOTE — ED Provider Notes (Signed)
Medical screening examination/treatment/procedure(s) were performed by non-physician practitioner and as supervising physician I was immediately available for consultation/collaboration.  EKG Interpretation   None        Martha K Linker, MD 01/02/13 1720 

## 2013-02-15 ENCOUNTER — Encounter (HOSPITAL_COMMUNITY): Payer: Self-pay | Admitting: Emergency Medicine

## 2013-02-15 ENCOUNTER — Emergency Department (HOSPITAL_COMMUNITY)
Admission: EM | Admit: 2013-02-15 | Discharge: 2013-02-16 | Disposition: A | Discharge status: Other Acute Inpatient Hospital | Payer: Medicaid Other | Attending: Emergency Medicine | Admitting: Emergency Medicine

## 2013-02-15 ENCOUNTER — Ambulatory Visit (HOSPITAL_COMMUNITY)
Admission: RE | Admit: 2013-02-15 | Discharge: 2013-02-15 | Disposition: A | Discharge status: Home / Self Care | Payer: Medicaid Other | Attending: Psychiatry | Admitting: Psychiatry

## 2013-02-15 DIAGNOSIS — F411 Generalized anxiety disorder: Secondary | ICD-10-CM | POA: Insufficient documentation

## 2013-02-15 DIAGNOSIS — F17200 Nicotine dependence, unspecified, uncomplicated: Secondary | ICD-10-CM | POA: Insufficient documentation

## 2013-02-15 DIAGNOSIS — F1210 Cannabis abuse, uncomplicated: Secondary | ICD-10-CM | POA: Insufficient documentation

## 2013-02-15 DIAGNOSIS — G8929 Other chronic pain: Secondary | ICD-10-CM | POA: Insufficient documentation

## 2013-02-15 DIAGNOSIS — J029 Acute pharyngitis, unspecified: Secondary | ICD-10-CM | POA: Insufficient documentation

## 2013-02-15 DIAGNOSIS — F3289 Other specified depressive episodes: Secondary | ICD-10-CM | POA: Insufficient documentation

## 2013-02-15 DIAGNOSIS — R45851 Suicidal ideations: Secondary | ICD-10-CM | POA: Insufficient documentation

## 2013-02-15 DIAGNOSIS — Z8619 Personal history of other infectious and parasitic diseases: Secondary | ICD-10-CM | POA: Insufficient documentation

## 2013-02-15 DIAGNOSIS — F1910 Other psychoactive substance abuse, uncomplicated: Secondary | ICD-10-CM | POA: Insufficient documentation

## 2013-02-15 DIAGNOSIS — F952 Tourette's disorder: Secondary | ICD-10-CM | POA: Insufficient documentation

## 2013-02-15 DIAGNOSIS — F329 Major depressive disorder, single episode, unspecified: Secondary | ICD-10-CM | POA: Insufficient documentation

## 2013-02-15 DIAGNOSIS — Z79899 Other long term (current) drug therapy: Secondary | ICD-10-CM | POA: Insufficient documentation

## 2013-02-15 MED ORDER — ALUM & MAG HYDROXIDE-SIMETH 200-200-20 MG/5ML PO SUSP: 30.0000 mL | ORAL | Status: DC | PRN

## 2013-02-15 MED ORDER — ONDANSETRON HCL 4 MG PO TABS: 4.0000 mg | ORAL_TABLET | Freq: Three times a day (TID) | ORAL | Status: DC | PRN

## 2013-02-15 MED ORDER — LORAZEPAM 1 MG PO TABS
1.0000 mg | ORAL_TABLET | Freq: Three times a day (TID) | ORAL | Status: DC | PRN
Start: 2013-02-15 — End: 2013-02-16
  Administered 2013-02-16 (×2): 1 mg via ORAL
  Filled 2013-02-15 (×2): qty 1

## 2013-02-15 MED ORDER — IBUPROFEN 200 MG PO TABS: 600.0000 mg | ORAL_TABLET | Freq: Three times a day (TID) | ORAL | Status: DC | PRN

## 2013-02-15 MED ORDER — ZOLPIDEM TARTRATE 5 MG PO TABS: 5.0000 mg | ORAL_TABLET | Freq: Every evening | ORAL | Status: DC | PRN

## 2013-02-15 MED ORDER — NICOTINE 21 MG/24HR TD PT24
21.0000 mg | MEDICATED_PATCH | Freq: Every day | TRANSDERMAL | Status: DC
  Administered 2013-02-16: 21 mg via TRANSDERMAL
  Filled 2013-02-15: qty 1

## 2013-02-15 NOTE — ED Notes (Signed)
Pt arrived to the ED with a need for medical clearance.  Pt states he was on Prestiq but has been off of it for a month and ahalf.  Pt state he lost his job and thus has had a lot of alone time which has caused him to become depressed
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
## 2013-02-15 NOTE — ED Notes (Signed)
Pt has in belonging bag:  Black boots, white socks, cameo wallet, [place] ID card, blue box cigs, blue jeans, grey thermal pants, grey thermal long sleeves, black shirt, grey zipped up hoodie, grey hat, dark blue winter jacket.

## 2013-02-15 NOTE — ED Provider Notes (Signed)
CSN: [id number]     Arrival date & time 02/15/13  2238 History  This chart was scribed for non-physician practitioner Ivonne AndrewPeter Atalaya Zappia, PA-C working with No att. providers found by Donne Anonayla Curran, ED Scribe. This patient was seen in room WTR4/WLPT4 and the patient's care was started at 11:20 PM.    Chief Complaint  Patient presents with  . Medical Clearance    The history is provided by the patient. No language interpreter was used.   HPI Comments: Jeff Browning is a 28 y.o. male with history of Hepatitis C, who presents to the Emergency Department needing medical clearance. He reports SI his entire year, and acutely worsening over the past week. He attempted suicide during childhood and 2 months ago by cutting his wrists. He states he was on Prestiq and trazodone and ran out of it for a month and a half. He states he has life stressors, including a new baby in his home life. He states he would like to go to behavior health.   He currently complains of 1 month of a gradual and intermittent onset sore throat. He denies cough, fever or any other pain. Does report noticing small white circle areas on the tonsils.   Past Medical History  Diagnosis Date  . Depression   . Chronic abdominal pain   . Anxiety   . Tourette disease   . Hepatitis C   . Alcohol dependence   . Opiate dependence    History reviewed. No pertinent past surgical history. Family History  Problem Relation Age of Onset  . Ulcerative colitis Father    History  Substance Use Topics  . Smoking status: Current Every Day Smoker -- 1.00 packs/day    Types: Cigarettes  . Smokeless tobacco: Not on file  . Alcohol Use: 7.2 oz/week    12 Cans of beer per week     Comment: Daily     Review of Systems  Constitutional: Negative for fever.  HENT: Positive for sore throat.   Respiratory: Negative for cough.   Psychiatric/Behavioral: Positive for suicidal ideas and self-injury.  All other systems reviewed and are  negative.    Allergies  Acetaminophen  Home Medications   Current Outpatient Rx  Name  Route  Sig  Dispense  Refill  . ALPRAZolam (XANAX) 1 MG tablet   Oral   Take 1 mg by mouth at bedtime as needed for anxiety.         Marland Kitchen. desvenlafaxine (PRISTIQ) 50 MG 24 hr tablet   Oral   Take 50 mg by mouth daily. Waiting for refill         . traZODone (DESYREL) 50 MG tablet   Oral   Take 50 mg by mouth at bedtime.          BP 113/69  Pulse 91  Temp(Src) 97.8 F (36.6 C) (Oral)  Resp 18  Ht 5\' 9"  (1.753 m)  Wt 155 lb (70.308 kg)  BMI 22.88 kg/m2  SpO2 97%  Physical Exam  Nursing note and vitals reviewed. Constitutional: He is oriented to person, place, and time. He appears well-developed and well-nourished. No distress.  HENT:  Head: Normocephalic and atraumatic.  Patient was slightly cryptic tonsils. There is a tonsillith in the left tonsil area. No significant erythema or edema. No discharge. Uvula midline.  Eyes: Conjunctivae are normal.  Neck: Normal range of motion. Neck supple. No tracheal deviation present.  Cardiovascular: Normal rate, regular rhythm and normal heart sounds.   Pulmonary/Chest: Effort  normal and breath sounds normal. No respiratory distress. He has no wheezes. He has no rales.  Musculoskeletal: Normal range of motion.  Neurological: He is alert and oriented to person, place, and time.  Skin: Skin is warm and dry.  Psychiatric: His behavior is normal. He exhibits a depressed mood. He expresses suicidal ideation. He expresses suicidal plans.    ED Course  Procedures  DIAGNOSTIC STUDIES: Oxygen Saturation is 97% on RA, adequate by my interpretation.    COORDINATION OF CARE: 11:17 PM Patient evaluated and appears in no apparent distress. Discussed treatment plan which includes medical clearance with pt at bedside and pt agreed to plan.   Psychiatric holding orders in place. TTS consult placed.  Results for orders placed during the hospital  encounter of 02/15/13  RAPID STREP SCREEN      Result Value Range   Streptococcus, Group A Screen (Direct) NEGATIVE  NEGATIVE  CBC WITH DIFFERENTIAL      Result Value Range   WBC 10.8 (*) 4.0 - 10.5 K/uL   RBC 4.88  4.22 - 5.81 MIL/uL   Hemoglobin 15.0  13.0 - 17.0 g/dL   HCT 21.3  08.6 - 57.8 %   MCV 88.3  78.0 - 100.0 fL   MCH 30.7  26.0 - 34.0 pg   MCHC 34.8  30.0 - 36.0 g/dL   RDW 46.9  62.9 - 52.8 %   Platelets 328  150 - 400 K/uL   Neutrophils Relative % 73  43 - 77 %   Neutro Abs 7.9 (*) 1.7 - 7.7 K/uL   Lymphocytes Relative 21  12 - 46 %   Lymphs Abs 2.3  0.7 - 4.0 K/uL   Monocytes Relative 5  3 - 12 %   Monocytes Absolute 0.5  0.1 - 1.0 K/uL   Eosinophils Relative 1  0 - 5 %   Eosinophils Absolute 0.1  0.0 - 0.7 K/uL   Basophils Relative 0  0 - 1 %   Basophils Absolute 0.0  0.0 - 0.1 K/uL  COMPREHENSIVE METABOLIC PANEL      Result Value Range   Sodium 140  137 - 147 mEq/L   Potassium 4.1  3.7 - 5.3 mEq/L   Chloride 100  96 - 112 mEq/L   CO2 26  19 - 32 mEq/L   Glucose, Bld 91  70 - 99 mg/dL   BUN 5 (*) 6 - 23 mg/dL   Creatinine, Ser 4.13  0.50 - 1.35 mg/dL   Calcium 9.3  8.4 - 24.4 mg/dL   Total Protein 8.0  6.0 - 8.3 g/dL   Albumin 4.6  3.5 - 5.2 g/dL   AST 010 (*) 0 - 37 U/L   ALT 399 (*) 0 - 53 U/L   Alkaline Phosphatase 94  39 - 117 U/L   Total Bilirubin 0.3  0.3 - 1.2 mg/dL   GFR calc non Af Amer >90  >90 mL/min   GFR calc Af Amer >90  >90 mL/min  ETHANOL      Result Value Range   Alcohol, Ethyl (B) 69 (*) 0 - 11 mg/dL  URINE RAPID DRUG SCREEN (HOSP PERFORMED)      Result Value Range   Opiates NONE DETECTED  NONE DETECTED   Cocaine NONE DETECTED  NONE DETECTED   Benzodiazepines NONE DETECTED  NONE DETECTED   Amphetamines NONE DETECTED  NONE DETECTED   Tetrahydrocannabinol POSITIVE (*) NONE DETECTED   Barbiturates NONE DETECTED  NONE DETECTED  MDM   1. Suicidal ideation      I personally performed the services described in this  documentation, which was scribed in my presence. The recorded information has been reviewed and is accurate.    Angus Seller, PA-C 02/16/13 [phone]

## 2013-02-16 ENCOUNTER — Inpatient Hospital Stay (HOSPITAL_COMMUNITY)
Admission: AD | Admit: 2013-02-16 | Discharge: 2013-02-20 | DRG: 885 | Disposition: A | Discharge status: Home / Self Care | Payer: Medicaid Other | Source: Intra-hospital | Attending: Psychiatry | Admitting: Psychiatry

## 2013-02-16 ENCOUNTER — Encounter (HOSPITAL_COMMUNITY): Payer: Self-pay | Admitting: Behavioral Health

## 2013-02-16 DIAGNOSIS — F1120 Opioid dependence, uncomplicated: Secondary | ICD-10-CM | POA: Diagnosis present

## 2013-02-16 DIAGNOSIS — Z79899 Other long term (current) drug therapy: Secondary | ICD-10-CM

## 2013-02-16 DIAGNOSIS — F952 Tourette's disorder: Secondary | ICD-10-CM | POA: Diagnosis present

## 2013-02-16 DIAGNOSIS — F1020 Alcohol dependence, uncomplicated: Secondary | ICD-10-CM | POA: Diagnosis present

## 2013-02-16 DIAGNOSIS — R45851 Suicidal ideations: Secondary | ICD-10-CM

## 2013-02-16 DIAGNOSIS — G4700 Insomnia, unspecified: Secondary | ICD-10-CM | POA: Diagnosis present

## 2013-02-16 DIAGNOSIS — F332 Major depressive disorder, recurrent severe without psychotic features: Principal | ICD-10-CM | POA: Diagnosis present

## 2013-02-16 DIAGNOSIS — F411 Generalized anxiety disorder: Secondary | ICD-10-CM | POA: Diagnosis present

## 2013-02-16 DIAGNOSIS — B1920 Unspecified viral hepatitis C without hepatic coma: Secondary | ICD-10-CM | POA: Diagnosis present

## 2013-02-16 LAB — CBC WITH DIFFERENTIAL/PLATELET
Basophils Absolute: 0 10*3/uL (ref 0.0–0.1)
Basophils Relative: 0 % (ref 0–1)
EOS PCT: 1 % (ref 0–5)
Eosinophils Absolute: 0.1 10*3/uL (ref 0.0–0.7)
HCT: 43.1 % (ref 39.0–52.0)
Hemoglobin: 15 g/dL (ref 13.0–17.0)
LYMPHS ABS: 2.3 10*3/uL (ref 0.7–4.0)
Lymphocytes Relative: 21 % (ref 12–46)
MCH: 30.7 pg (ref 26.0–34.0)
MCHC: 34.8 g/dL (ref 30.0–36.0)
MCV: 88.3 fL (ref 78.0–100.0)
MONO ABS: 0.5 10*3/uL (ref 0.1–1.0)
MONOS PCT: 5 % (ref 3–12)
Neutro Abs: 7.9 10*3/uL — ABNORMAL HIGH (ref 1.7–7.7)
Neutrophils Relative %: 73 % (ref 43–77)
Platelets: 328 10*3/uL (ref 150–400)
RBC: 4.88 MIL/uL (ref 4.22–5.81)
RDW: 12.4 % (ref 11.5–15.5)
WBC: 10.8 10*3/uL — ABNORMAL HIGH (ref 4.0–10.5)

## 2013-02-16 LAB — COMPREHENSIVE METABOLIC PANEL
ALK PHOS: 94 U/L (ref 39–117)
ALT: 399 U/L — ABNORMAL HIGH (ref 0–53)
AST: 109 U/L — ABNORMAL HIGH (ref 0–37)
Albumin: 4.6 g/dL (ref 3.5–5.2)
BUN: 5 mg/dL — AB (ref 6–23)
CALCIUM: 9.3 mg/dL (ref 8.4–10.5)
CO2: 26 mEq/L (ref 19–32)
Chloride: 100 mEq/L (ref 96–112)
Creatinine, Ser: 0.8 mg/dL (ref 0.50–1.35)
GFR calc non Af Amer: >90 mL/min (ref >90)
GLUCOSE: 91 mg/dL (ref 70–99)
Potassium: 4.1 mEq/L (ref 3.7–5.3)
Sodium: 140 mEq/L (ref 137–147)
TOTAL PROTEIN: 8 g/dL (ref 6.0–8.3)
Total Bilirubin: 0.3 mg/dL (ref 0.3–1.2)

## 2013-02-16 LAB — RAPID URINE DRUG SCREEN, HOSP PERFORMED
Amphetamines: NOT DETECTED
Barbiturates: NOT DETECTED
Benzodiazepines: NOT DETECTED
COCAINE: NOT DETECTED
Opiates: NOT DETECTED
Tetrahydrocannabinol: POSITIVE — AB

## 2013-02-16 LAB — RAPID STREP SCREEN (MED CTR MEBANE ONLY): Streptococcus, Group A Screen (Direct): NEGATIVE

## 2013-02-16 LAB — ETHANOL: ALCOHOL ETHYL (B): 69 mg/dL — AB (ref 0–11)

## 2013-02-16 MED ORDER — DESVENLAFAXINE SUCCINATE ER 50 MG PO TB24
50.0000 mg | ORAL_TABLET | Freq: Every day | ORAL | Status: DC
  Administered 2013-02-17 – 2013-02-20 (×4): 50 mg via ORAL
  Filled 2013-02-16 (×5): qty 1
  Filled 2013-02-16: qty 10

## 2013-02-16 MED ORDER — TRAZODONE HCL 100 MG PO TABS: 100.0000 mg | ORAL_TABLET | Freq: Every day | ORAL | Status: DC

## 2013-02-16 MED ORDER — HYDROXYZINE HCL 25 MG PO TABS
100.0000 mg | ORAL_TABLET | Freq: Every day | ORAL | Status: DC
  Administered 2013-02-16: 100 mg via ORAL
  Filled 2013-02-16: qty 4

## 2013-02-16 MED ORDER — ALUM & MAG HYDROXIDE-SIMETH 200-200-20 MG/5ML PO SUSP: 30.0000 mL | ORAL | Status: DC | PRN

## 2013-02-16 MED ORDER — HYDROXYZINE HCL 25 MG PO TABS
25.0000 mg | ORAL_TABLET | Freq: Two times a day (BID) | ORAL | Status: DC | PRN
  Administered 2013-02-17: 25 mg via ORAL
  Filled 2013-02-16: qty 1

## 2013-02-16 MED ORDER — HYDROXYZINE HCL 50 MG PO TABS
100.0000 mg | ORAL_TABLET | Freq: Every day | ORAL | Status: DC
  Administered 2013-02-17 – 2013-02-19 (×3): 100 mg via ORAL
  Filled 2013-02-16 (×5): qty 2

## 2013-02-16 MED ORDER — NICOTINE 21 MG/24HR TD PT24: 21.0000 mg | MEDICATED_PATCH | Freq: Every day | TRANSDERMAL | Status: DC

## 2013-02-16 MED ORDER — DESVENLAFAXINE SUCCINATE ER 50 MG PO TB24: 50.0000 mg | ORAL_TABLET | Freq: Every day | ORAL | Status: DC

## 2013-02-16 MED ORDER — ONDANSETRON HCL 4 MG PO TABS: 4.0000 mg | ORAL_TABLET | Freq: Three times a day (TID) | ORAL | Status: DC | PRN

## 2013-02-16 MED ORDER — MAGNESIUM HYDROXIDE 400 MG/5ML PO SUSP: 30.0000 mL | Freq: Every day | ORAL | Status: DC | PRN

## 2013-02-16 MED ORDER — HYDROXYZINE HCL 25 MG PO TABS
25.0000 mg | ORAL_TABLET | Freq: Two times a day (BID) | ORAL | Status: DC | PRN
  Administered 2013-02-16: 25 mg via ORAL
  Filled 2013-02-16: qty 1

## 2013-02-16 MED ORDER — IBUPROFEN 600 MG PO TABS: 600.0000 mg | ORAL_TABLET | Freq: Three times a day (TID) | ORAL | Status: DC | PRN

## 2013-02-16 NOTE — Consult Note (Signed)
[facility] Face-to-Face Psychiatry Consult   Reason for Consult: SI/Depression Referring Physician:  EDP SETH HIGGINBOTHAM is an 28 y.o. male.  Assessment: AXIS I:  Anxiety Disorder NOS and Depressive Disorder NOS AXIS II:  Deferred AXIS III:   Past Medical History  Diagnosis Date  . Depression   . Chronic abdominal pain   . Anxiety   . Tourette disease   . Hepatitis C   . Alcohol dependence   . Opiate dependence    AXIS IV:  economic problems, educational problems, housing problems, occupational problems, other psychosocial or environmental problems, problems related to legal system/crime, problems related to social environment, problems with access to health care services and problems with primary support group AXIS V:  31-40 impairment in reality testing  Plan:  Recommend psychiatric Inpatient admission when medically cleared.  Subjective:   DARLENE BARTELT is a 28 y.o. male patient with SI/Depression. He is domiciled, and unemployed. His depression/SI is acutely worsening over the past week. He attempted suicide during childhood and 2 months ago by cutting his wrist. He states that he was on prestiq and trazodone and ran out of it for a month and a half. He states he has life stressors, including a new baby in his home life. He states he would like to [employer]. He has strained relations with the wife and parents. He continues to endorse SI/depression and cannot contract for safety. He has a history of THC abuse.   HPI: Patient is a 28 year old w HPI Elements:   Location:  generalized. Quality:  si/depression. Severity:  severe. Timing:  constant. Duration:  chronic. Context:  stressors.  Past Psychiatric History: Past Medical History  Diagnosis Date  . Depression   . Chronic abdominal pain   . Anxiety   . Tourette disease   . Hepatitis C   . Alcohol dependence   . Opiate dependence     reports that he has been smoking Cigarettes.  He has been smoking about 1.00  pack per day. He does not have any smokeless tobacco history on file. He reports that he drinks about 7.2 ounces of alcohol per week. He reports that he uses illicit drugs (IV, Cocaine, and Marijuana). Family History  Problem Relation Age of Onset  . Ulcerative colitis Father            Allergies:   Allergies  Allergen Reactions  . Acetaminophen     Hep C    ACT Assessment Complete:  Yes:    Educational Status    Risk to Self: Risk to self Is patient at risk for suicide?: Yes Substance abuse history and/or treatment for substance abuse?: Yes  Risk to Others:    Abuse:    Prior Inpatient Therapy:    Prior Outpatient Therapy:    Additional Information:                    Objective: Blood pressure 125/77, pulse 81, temperature 97.9 F (36.6 C), temperature source Oral, resp. rate 17, height 5' 9" (1.753 m), weight 70.308 kg (155 lb), SpO2 98.00%.Body mass index is 22.88 kg/(m^2). Results for orders placed during the hospital encounter of 02/15/13 (from the past 72 hour(s))  URINE RAPID DRUG SCREEN (HOSP PERFORMED)     Status: Abnormal   Collection Time    02/15/13 11:30 PM      Result Value Range   Opiates NONE DETECTED  NONE DETECTED   Cocaine NONE DETECTED  NONE DETECTED  Benzodiazepines NONE DETECTED  NONE DETECTED   Amphetamines NONE DETECTED  NONE DETECTED   Tetrahydrocannabinol POSITIVE (*) NONE DETECTED   Barbiturates NONE DETECTED  NONE DETECTED   Comment:            DRUG SCREEN FOR MEDICAL PURPOSES     ONLY.  IF CONFIRMATION IS NEEDED     FOR ANY PURPOSE, NOTIFY LAB     WITHIN 5 DAYS.                LOWEST DETECTABLE LIMITS     FOR URINE DRUG SCREEN     Drug Class       Cutoff (ng/mL)     Amphetamine      1000     Barbiturate      200     Benzodiazepine   825     Tricyclics       053     Opiates          300     Cocaine          300     THC              50  CBC WITH DIFFERENTIAL     Status: Abnormal   Collection Time    02/15/13 11:52  PM      Result Value Range   WBC 10.8 (*) 4.0 - 10.5 K/uL   RBC 4.88  4.22 - 5.81 MIL/uL   Hemoglobin 15.0  13.0 - 17.0 g/dL   HCT 43.1  39.0 - 52.0 %   MCV 88.3  78.0 - 100.0 fL   MCH 30.7  26.0 - 34.0 pg   MCHC 34.8  30.0 - 36.0 g/dL   RDW 12.4  11.5 - 15.5 %   Platelets 328  150 - 400 K/uL   Neutrophils Relative % 73  43 - 77 %   Neutro Abs 7.9 (*) 1.7 - 7.7 K/uL   Lymphocytes Relative 21  12 - 46 %   Lymphs Abs 2.3  0.7 - 4.0 K/uL   Monocytes Relative 5  3 - 12 %   Monocytes Absolute 0.5  0.1 - 1.0 K/uL   Eosinophils Relative 1  0 - 5 %   Eosinophils Absolute 0.1  0.0 - 0.7 K/uL   Basophils Relative 0  0 - 1 %   Basophils Absolute 0.0  0.0 - 0.1 K/uL  COMPREHENSIVE METABOLIC PANEL     Status: Abnormal   Collection Time    02/15/13 11:52 PM      Result Value Range   Sodium 140  137 - 147 mEq/L   Potassium 4.1  3.7 - 5.3 mEq/L   Chloride 100  96 - 112 mEq/L   CO2 26  19 - 32 mEq/L   Glucose, Bld 91  70 - 99 mg/dL   BUN 5 (*) 6 - 23 mg/dL   Creatinine, Ser 0.80  0.50 - 1.35 mg/dL   Calcium 9.3  8.4 - 10.5 mg/dL   Total Protein 8.0  6.0 - 8.3 g/dL   Albumin 4.6  3.5 - 5.2 g/dL   AST 109 (*) 0 - 37 U/L   ALT 399 (*) 0 - 53 U/L   Alkaline Phosphatase 94  39 - 117 U/L   Total Bilirubin 0.3  0.3 - 1.2 mg/dL   GFR calc non Af Amer >90  >90 mL/min   GFR calc Af Amer >90  >90 mL/min   Comment: (NOTE)  The eGFR has been calculated using the CKD EPI equation.     This calculation has not been validated in all clinical situations.     eGFR's persistently <90 mL/min signify possible Chronic Kidney     Disease.  ETHANOL     Status: Abnormal   Collection Time    02/15/13 11:52 PM      Result Value Range   Alcohol, Ethyl (B) 69 (*) 0 - 11 mg/dL   Comment:            LOWEST DETECTABLE LIMIT FOR     SERUM ALCOHOL IS 11 mg/dL     FOR MEDICAL PURPOSES ONLY  RAPID STREP SCREEN     Status: None   Collection Time    02/16/13 12:36 AM      Result Value Range   Streptococcus,  Group A Screen (Direct) NEGATIVE  NEGATIVE   Comment: (NOTE)     A Rapid Antigen test may result negative if the antigen level in the     sample is below the detection level of this test. The FDA has not     cleared this test as a stand-alone test therefore the rapid antigen     negative result has reflexed to a Group A Strep culture.   Labs are reviewed and are pertinent for [facility]  Current Facility-Administered Medications  Medication Dose Route Frequency Provider Last Rate Last Dose  . alum & mag hydroxide-simeth (MAALOX/MYLANTA) 200-200-20 MG/5ML suspension 30 mL  30 mL Oral PRN Martie Lee, PA-C      . Derrill Memo ON 02/17/2013] desvenlafaxine (PRISTIQ) 24 hr tablet 50 mg  50 mg Oral Daily Meghan Blankmann, NP      . hydrOXYzine (ATARAX/VISTARIL) tablet 100 mg  100 mg Oral QHS Meghan Blankmann, NP      . hydrOXYzine (ATARAX/VISTARIL) tablet 25 mg  25 mg Oral BID PRN Madison Hickman, NP   25 mg at 02/16/13 1729  . ibuprofen (ADVIL,MOTRIN) tablet 600 mg  600 mg Oral Q8H PRN Ruthell Rummage Dammen, PA-C      . nicotine (NICODERM CQ - dosed in mg/24 hours) patch 21 mg  21 mg Transdermal Daily Ruthell Rummage Dammen, PA-C   21 mg at 02/16/13 1144  . ondansetron (ZOFRAN) tablet 4 mg  4 mg Oral Q8H PRN Martie Lee, PA-C       Current Outpatient Prescriptions  Medication Sig Dispense Refill  . ALPRAZolam (XANAX) 1 MG tablet Take 1 mg by mouth at bedtime as needed for anxiety.      Marland Kitchen desvenlafaxine (PRISTIQ) 50 MG 24 hr tablet Take 50 mg by mouth daily. Waiting for refill      . traZODone (DESYREL) 50 MG tablet Take 50 mg by mouth at bedtime.        Psychiatric Specialty Exam:     Blood pressure 125/77, pulse 81, temperature 97.9 F (36.6 C), temperature source Oral, resp. rate 17, height 5' 9" (1.753 m), weight 70.308 kg (155 lb), SpO2 98.00%.Body mass index is 22.88 kg/(m^2).  General Appearance: Disheveled  Eye Sport and exercise psychologist::  Fair  Speech:  Slow  Volume:  Decreased  Mood:  Anxious, Depressed, Dysphoric,  Hopeless and Worthless  Affect:  Constricted, Depressed and Restricted  Thought Process:  Coherent and Logical  Orientation:  Full (Time, Place, and Person)  Thought Content:  Rumination  Suicidal Thoughts:  Yes.  with intent/plan  Homicidal Thoughts:  No  Memory:  Immediate;   Fair Recent;   Fair Remote;  Fair  Judgement:  Fair  Insight:  Fair  Psychomotor Activity:  Decreased and Psychomotor Retardation  Concentration:  Fair  Recall:  Fair  Akathisia:  No  Handed:  Right  AIMS (if indicated):   0  Assets:  Desire for Improvement Physical Health Resilience  Sleep:   fair    Treatment Plan Summary: Daily contact with patient to assess and evaluate symptoms and progress in treatment Medication management.  Patient is a 28 year old with SI/Depression; worsening depression within the past week. He ran out of his medications: Pristiq and Trazodone for the past several months. He continues to have SI/Depression. He denies homicidal ideations or psychosis. He has strained relations with his wife and parents, and he has life stressors, such as new baby at home. He will be restarted on his Pristiq 50 mg poQD, Hydroxyzine 25 mg BID, 100 mg at HS for anxiety/sleep. Patient to go to 504-1.   Kallie Edward [facility] 02/16/2013 5:58 PM

## 2013-02-16 NOTE — Progress Notes (Signed)
MHT initiated inpatient placement at the following hospitals with bed availability:  1)[facility] 2)[facility] 3[facility])[place] 5)[place] [facility] 10)[facility] 11)[facility]  Blain PaisMichelle L Enola Siebers, MHT/NS

## 2013-02-16 NOTE — Progress Notes (Signed)
28 year old male admitted voluntarily for depression and suicidal ideation with no plan. He identifies his stressors as family issues, financial issues, work, and the stress of a new baby. He currently lives with his children and fiance. He has a history of Tourettes syndrome, Hep C and social anxiety. He does have a history of previous suicide attempts. He states that he drinks 1-2 beers a day and that he currently only abuse THC. He has been noncompliant with his medications (pristiq and trazodone) except for xanax for the past 1.5 months. Pt educated on unit rules, plan and verbalized understanding. Pt belongings searched and skin visually assessed by RN. Pt oriented to unit and room. No complaints of pain or discomfort at this time. Q15 min checks maintained for safety. Pt remains safe on the unit.

## 2013-02-16 NOTE — ED Notes (Signed)
Pt arrived to his room; no s/s of distress noted. Pt given soda and blankets. Pt verbally contracts for safety.

## 2013-02-16 NOTE — ED Provider Notes (Signed)
Medical screening examination/treatment/procedure(s) were performed by non-physician practitioner and as supervising physician I was immediately available for consultation/collaboration.  EKG Interpretation   None         Shahara Hartsfield, MD 02/16/13 0631 

## 2013-02-16 NOTE — BH Assessment (Signed)
[facility]. Attending MD Jonnalagadda, assigned bed #504-2.

## 2013-02-16 NOTE — Tx Team (Signed)
Initial Interdisciplinary Treatment Plan  PATIENT STRENGTHS: (choose at least two) Active sense of humor Average or above average intelligence Communication skills General fund of knowledge Physical Health Special hobby/interest Supportive family/friends Work skills  PATIENT STRESSORS: Financial difficulties Marital or family conflict Medication change or noncompliance Occupational concerns   PROBLEM LIST: Problem List/Patient Goals Date to be addressed Date deferred Reason deferred Estimated date of resolution  Suicidal ideation 02/16/13     Depression 02/16/13                                                DISCHARGE CRITERIA:  Improved stabilization in mood, thinking, and/or behavior Verbal commitment to aftercare and medication compliance  PRELIMINARY DISCHARGE PLAN: Outpatient therapy  PATIENT/FAMIILY INVOLVEMENT: This treatment plan has been presented to and reviewed with the patient, Nolon Nationshomas T Herrera, and/or family member.  The patient and family have been given the opportunity to ask questions and make suggestions.  Leda QuailSmith, Shaena Parkerson T 02/16/2013, 10:44 PM

## 2013-02-16 NOTE — ED Notes (Signed)
Pt attended group in the activity room. Group topic was sleep hygiene and positive benefits to getting appropriate amounts of sleep and ways to improve their sleep hygiene. Pt stated he needed to work on not forcing himself to sleep. Pt stated he often has trouble getting to sleep and will lay in bed tossing and turning for hours. Pt stated he could work on meditating before bed to try to help him get to sleep easier at night.

## 2013-02-16 NOTE — Consult Note (Signed)
Patient continues to endorse suicidal ideation, reports that his depression has worsened over the past 2 months and that he is unable to contract for safety. He feels that his family is not supportive and wants to get better so he can spend more time with his children. He does report that he uses marijuana but adds that he's not been using any other substances. Patient needs to be hospitalized as he does not contract for safety and reports multiple stressors

## 2013-02-16 NOTE — ED Notes (Signed)
Patient appears anxious. Expressing feelings of hopelessness. Denies SI at present. Encouragement offered. [facility] Safety maintained, Q 15 checks continue.

## 2013-02-17 ENCOUNTER — Encounter (HOSPITAL_COMMUNITY): Payer: Self-pay | Admitting: Psychiatry

## 2013-02-17 DIAGNOSIS — F1020 Alcohol dependence, uncomplicated: Secondary | ICD-10-CM

## 2013-02-17 DIAGNOSIS — F332 Major depressive disorder, recurrent severe without psychotic features: Principal | ICD-10-CM

## 2013-02-17 DIAGNOSIS — F1120 Opioid dependence, uncomplicated: Secondary | ICD-10-CM

## 2013-02-17 LAB — CULTURE, GROUP A STREP

## 2013-02-17 MED ORDER — CHLORDIAZEPOXIDE HCL 25 MG PO CAPS: 25.0000 mg | ORAL_CAPSULE | Freq: Four times a day (QID) | ORAL | Status: AC | PRN

## 2013-02-17 MED ORDER — CHLORDIAZEPOXIDE HCL 25 MG PO CAPS
25.0000 mg | ORAL_CAPSULE | ORAL | Status: DC
  Filled 2013-02-17: qty 1

## 2013-02-17 MED ORDER — NICOTINE 21 MG/24HR TD PT24
MEDICATED_PATCH | TRANSDERMAL | Status: AC
  Administered 2013-02-17: 10:00:00
  Filled 2013-02-17: qty 1

## 2013-02-17 MED ORDER — CHLORDIAZEPOXIDE HCL 25 MG PO CAPS: 25.0000 mg | ORAL_CAPSULE | Freq: Every day | ORAL | Status: DC

## 2013-02-17 MED ORDER — VITAMIN B-1 100 MG PO TABS
100.0000 mg | ORAL_TABLET | Freq: Every day | ORAL | Status: DC
  Administered 2013-02-18 – 2013-02-20 (×3): 100 mg via ORAL
  Filled 2013-02-17 (×5): qty 1

## 2013-02-17 MED ORDER — NICOTINE POLACRILEX 2 MG MT GUM
CHEWING_GUM | OROMUCOSAL | Status: AC
  Filled 2013-02-17: qty 1

## 2013-02-17 MED ORDER — THIAMINE HCL 100 MG/ML IJ SOLN: 100.0000 mg | Freq: Once | INTRAMUSCULAR | Status: DC

## 2013-02-17 MED ORDER — CHLORDIAZEPOXIDE HCL 25 MG PO CAPS
25.0000 mg | ORAL_CAPSULE | Freq: Four times a day (QID) | ORAL | Status: AC
  Administered 2013-02-17 – 2013-02-18 (×3): 25 mg via ORAL
  Filled 2013-02-17 (×4): qty 1

## 2013-02-17 MED ORDER — NICOTINE POLACRILEX 2 MG MT GUM
2.0000 mg | CHEWING_GUM | OROMUCOSAL | Status: DC | PRN
  Administered 2013-02-17 – 2013-02-20 (×9): 2 mg via ORAL
  Filled 2013-02-17 (×6): qty 1

## 2013-02-17 MED ORDER — ONDANSETRON 4 MG PO TBDP
4.0000 mg | ORAL_TABLET | Freq: Four times a day (QID) | ORAL | Status: AC | PRN
  Administered 2013-02-19: 4 mg via ORAL
  Filled 2013-02-17: qty 1

## 2013-02-17 MED ORDER — CHLORDIAZEPOXIDE HCL 25 MG PO CAPS
ORAL_CAPSULE | ORAL | Status: AC
  Filled 2013-02-17: qty 1

## 2013-02-17 MED ORDER — CHLORDIAZEPOXIDE HCL 25 MG PO CAPS
25.0000 mg | ORAL_CAPSULE | Freq: Three times a day (TID) | ORAL | Status: AC
  Administered 2013-02-17 – 2013-02-18 (×2): 25 mg via ORAL
  Filled 2013-02-17 (×2): qty 1

## 2013-02-17 MED ORDER — ADULT MULTIVITAMIN W/MINERALS CH
1.0000 | ORAL_TABLET | Freq: Every day | ORAL | Status: DC
  Administered 2013-02-18 – 2013-02-20 (×3): 1 via ORAL
  Filled 2013-02-17 (×6): qty 1

## 2013-02-17 MED ORDER — LOPERAMIDE HCL 2 MG PO CAPS: 2.0000 mg | ORAL_CAPSULE | ORAL | Status: AC | PRN

## 2013-02-17 MED ORDER — HYDROXYZINE HCL 25 MG PO TABS
25.0000 mg | ORAL_TABLET | Freq: Three times a day (TID) | ORAL | Status: DC | PRN
  Administered 2013-02-17 – 2013-02-19 (×5): 25 mg via ORAL
  Filled 2013-02-17 (×5): qty 1

## 2013-02-17 NOTE — [facility] Suicide Risk Assessment (Signed)
Suicide Risk Assessment  Admission Assessment     Nursing information obtained from:  Patient Demographic factors:  Male;Caucasian Current Mental Status:  NA Loss Factors:  Financial problems / change in socioeconomic status Historical Factors:  NA Risk Reduction Factors:  Responsible for children under 26 years of age;Sense of responsibility to family;Positive social support;Employed;Living with another person, especially a relative  CLINICAL FACTORS:   Depression:   Anhedonia Comorbid alcohol abuse/dependence Hopelessness Impulsivity Insomnia Alcohol/Substance Abuse/Dependencies More than one psychiatric diagnosis Unstable or Poor Therapeutic Relationship Previous Psychiatric Diagnoses and Treatments  COGNITIVE FEATURES THAT CONTRIBUTE TO RISK:  Closed-mindedness Polarized thinking    SUICIDE RISK:   Moderate:  Frequent suicidal ideation with limited intensity, and duration, some specificity in terms of plans, no associated intent, good self-control, limited dysphoria/symptomatology, some risk factors present, and identifiable protective factors, including available and accessible social support.  PLAN OF CARE:  I certify that inpatient services furnished can reasonably be expected to improve the patient's condition.  Erle Guster 02/17/2013, 10:24 AM

## 2013-02-17 NOTE — Progress Notes (Signed)
[facility] Group Notes:  (Nursing/MHT/Case Management/Adjunct)  Date:  02/17/2013  Time:  10:02 PM  Type of Therapy:  Group Therapy  Participation Level:  Active  Participation Quality:  Appropriate  Affect:  Appropriate  Cognitive:  Appropriate  Insight:  Appropriate  Engagement in Group:  Engaged  Modes of Intervention:  Discussion  Summary of Progress/Problems:The pt expressed that last night was not a good night.The pt also said that his day started bad and got better.  Octavio Mannshigpen, Courvoisier Hamblen Lee 02/17/2013, 10:02 PM

## 2013-02-17 NOTE — Progress Notes (Signed)
Psychoeducational Group Note  Date:  09/11/2011 Time: 1015  Group Topic/Focus:  Identifying Needs:   The focus of this group is to help patients identify their personal needs that have been historically problematic and identify healthy behaviors to address their needs.  Participation Level:  active Participation Quality: good Affect: flat Cognitive: good   Insight:  good  Engagement in Group: engaged  Additional Comments:  PD RN bC

## 2013-02-17 NOTE — Progress Notes (Signed)
Psychoeducational Group Note  Date:  02/17/2013 Time: 1015  Group Topic/Focus:  Identifying Needs:   The focus of this group is to help patients identify their personal needs that have been historically problematic and identify healthy behaviors to address their needs.  Participation Level:  Active  Participation Quality:  Appropriate  Affect:  Appropriate  Cognitive:  Appropriate  Insight:  Engaged  Engagement in Group:  Engaged  Additional Comments:    02/17/2013,2:13 PM Yeva Bissette, Joie BimlerPatricia Lynn

## 2013-02-17 NOTE — H&P (Signed)
Psychiatric Admission Assessment Adult  Patient Identification:  Jeff Browning Date of Evaluation:  02/17/2013 Chief Complaint:  major depression, recurrent severe History of Present Illness:  28 y.o. male. Patient presents to the [facility] complaining of suicidal ideation for the past 2 days. States depression comes and goes. Thinks about being dead. Can not identify any factors that would decrease suicide risk. Patient states he has attempted suicide three times in the past year by cutting his wrist. Feels "worthless". Symptoms include insomnia, isolating, guilt, and irritability. Admits to staying in bed, not bathing, and not grooming. Stopped taking medications 1.5 months ago when he ran out and did not return to psychiatrist.  Patient smokes marijuana daily and drinks 6-8 beers every 2-3 days. He stopped using heroin on Memorial Day. Denies withdrawal symptoms. Has been using since he was 28 years old. Strong family history of substance abuse. Denies psychosis and homicidal ideation. History of assault charges 5 years ago.   Elements:  Location:  genralized. Quality:  acute. Severity:  severe. Timing:  constant. Duration:  few months. Context:  stressors. Associated Signs/Synptoms: Depression Symptoms:  depressed mood, feelings of worthlessness/guilt, difficulty concentrating, suicidal thoughts with specific plan, anxiety, disturbed sleep, (Hypo) Manic Symptoms:  None Anxiety Symptoms:  Excessive Worry, Psychotic Symptoms:  None  PTSD Symptoms:  None  Psychiatric Specialty Exam: Physical Exam  Constitutional: He is oriented to person, place, and time. He appears well-developed and well-nourished.  HENT:  Head: Normocephalic and atraumatic.  Neck: Normal range of motion.  Respiratory: Effort normal.  Musculoskeletal: Normal range of motion.  Neurological: He is alert and oriented to person, place, and time.  Skin: Skin is warm and dry.   Complete physical  in ED, reviewed, concur with findings  Review of Systems  Constitutional: Negative.   HENT: Negative.   Eyes: Negative.   Respiratory: Negative.   Cardiovascular: Negative.   Gastrointestinal: Negative.   Genitourinary: Negative.   Musculoskeletal: Negative.   Skin: Negative.   Neurological: Negative.   Endo/Heme/Allergies: Negative.   Psychiatric/Behavioral: Positive for depression, suicidal ideas and substance abuse. The patient is nervous/anxious.     Blood pressure 121/77, pulse 80, temperature 98.2 F (36.8 C), temperature source Oral, resp. rate 16, height '5\' 8"'  (1.727 m), weight 74.844 kg (165 lb).Body mass index is 25.09 kg/(m^2).  General Appearance: Disheveled  Eye Contact::  Minimal  Speech:  Slow  Volume:  Decreased  Mood:  Anxious and Depressed  Affect:  Congruent  Thought Process:  Coherent  Orientation:  Full (Time, Place, and Person)  Thought Content:  Rumination  Suicidal Thoughts:  Yes.  with intent/plan  Homicidal Thoughts:  No  Memory:  Immediate;   Fair Recent;   Fair Remote;   Fair  Judgement:  Poor  Insight:  Lacking  Psychomotor Activity:  Decreased  Concentration:  Fair  Recall:  Fair  Akathisia:  No  Handed:  Right  AIMS (if indicated):     Assets:  Leisure Time Physical Health Resilience Social Support  Sleep:       Past Psychiatric History: Diagnosis:  Heroine dependency, Alcohol abuse, depression, anxiety  Hospitalizations:  [facility] once  Outpatient Care:  None  Substance Abuse Care:  [facility]  Self-Mutilation:  Cutter in the past  Suicidal Attempts:  Cut  Violent Behaviors:  None   Past Medical History:   Past Medical History  Diagnosis Date  . Depression   . Chronic abdominal pain   . Anxiety   . Tourette  disease   . Hepatitis C   . Alcohol dependence   . Opiate dependence    Loss of Consciousness:  "knocked out 4 or 5 times" Allergies:   Allergies  Allergen Reactions  . Acetaminophen     Hep C   PTA  Medications: Prescriptions prior to admission  Medication Sig Dispense Refill  . ALPRAZolam (XANAX) 1 MG tablet Take 1 mg by mouth at bedtime as needed for anxiety.      Marland Kitchen desvenlafaxine (PRISTIQ) 50 MG 24 hr tablet Take 50 mg by mouth daily. Waiting for refill      . traZODone (DESYREL) 50 MG tablet Take 50 mg by mouth at bedtime.        Previous Psychotropic Medications:  Medication/Dose   See above   Substance Abuse History in the last 12 months:  yes  Consequences of Substance Abuse: Withdrawal Symptoms:   anxiety  Social History:  reports that he has been smoking Cigarettes.  He has been smoking about 1.00 pack per day. He does not have any smokeless tobacco history on file. He reports that he drinks about 7.2 ounces of alcohol per week. He reports that he uses illicit drugs (IV, Cocaine, and Marijuana). Additional Social History:  Current Place of Residence:   Place of Birth:   Family Members: Marital Status:  Single Children:  Sons:  Daughters: Relationships: Education:  tenth grade Educational Problems/Performance: Religious Beliefs/Practices: History of Abuse (Emotional/Phsycial/Sexual) Occupational Experiences; Military History:  None. Legal History: Hobbies/Interests:  Family History:   Family History  Problem Relation Age of Onset  . Ulcerative colitis Father     Results for orders placed during the hospital encounter of 02/15/13 (from the past 72 hour(s))  URINE RAPID DRUG SCREEN (HOSP PERFORMED)     Status: Abnormal   Collection Time    02/15/13 11:30 PM      Result Value Range   Opiates NONE DETECTED  NONE DETECTED   Cocaine NONE DETECTED  NONE DETECTED   Benzodiazepines NONE DETECTED  NONE DETECTED   Amphetamines NONE DETECTED  NONE DETECTED   Tetrahydrocannabinol POSITIVE (*) NONE DETECTED   Barbiturates NONE DETECTED  NONE DETECTED   Comment:            DRUG SCREEN FOR MEDICAL PURPOSES     ONLY.  IF CONFIRMATION IS NEEDED     FOR ANY  PURPOSE, NOTIFY LAB     WITHIN 5 DAYS.                LOWEST DETECTABLE LIMITS     FOR URINE DRUG SCREEN     Drug Class       Cutoff (ng/mL)     Amphetamine      1000     Barbiturate      200     Benzodiazepine   093     Tricyclics       267     Opiates          300     Cocaine          300     THC              50  CBC WITH DIFFERENTIAL     Status: Abnormal   Collection Time    02/15/13 11:52 PM      Result Value Range   WBC 10.8 (*) 4.0 - 10.5 K/uL   RBC 4.88  4.22 - 5.81 MIL/uL   Hemoglobin 15.0  13.0 - 17.0 g/dL   HCT 43.1  39.0 - 52.0 %   MCV 88.3  78.0 - 100.0 fL   MCH 30.7  26.0 - 34.0 pg   MCHC 34.8  30.0 - 36.0 g/dL   RDW 12.4  11.5 - 15.5 %   Platelets 328  150 - 400 K/uL   Neutrophils Relative % 73  43 - 77 %   Neutro Abs 7.9 (*) 1.7 - 7.7 K/uL   Lymphocytes Relative 21  12 - 46 %   Lymphs Abs 2.3  0.7 - 4.0 K/uL   Monocytes Relative 5  3 - 12 %   Monocytes Absolute 0.5  0.1 - 1.0 K/uL   Eosinophils Relative 1  0 - 5 %   Eosinophils Absolute 0.1  0.0 - 0.7 K/uL   Basophils Relative 0  0 - 1 %   Basophils Absolute 0.0  0.0 - 0.1 K/uL  COMPREHENSIVE METABOLIC PANEL     Status: Abnormal   Collection Time    02/15/13 11:52 PM      Result Value Range   Sodium 140  137 - 147 mEq/L   Potassium 4.1  3.7 - 5.3 mEq/L   Chloride 100  96 - 112 mEq/L   CO2 26  19 - 32 mEq/L   Glucose, Bld 91  70 - 99 mg/dL   BUN 5 (*) 6 - 23 mg/dL   Creatinine, Ser 0.80  0.50 - 1.35 mg/dL   Calcium 9.3  8.4 - 10.5 mg/dL   Total Protein 8.0  6.0 - 8.3 g/dL   Albumin 4.6  3.5 - 5.2 g/dL   AST 109 (*) 0 - 37 U/L   ALT 399 (*) 0 - 53 U/L   Alkaline Phosphatase 94  39 - 117 U/L   Total Bilirubin 0.3  0.3 - 1.2 mg/dL   GFR calc non Af Amer >90  >90 mL/min   GFR calc Af Amer >90  >90 mL/min   Comment: (NOTE)     The eGFR has been calculated using the CKD EPI equation.     This calculation has not been validated in all clinical situations.     eGFR's persistently <90 mL/min signify  possible Chronic Kidney     Disease.  ETHANOL     Status: Abnormal   Collection Time    02/15/13 11:52 PM      Result Value Range   Alcohol, Ethyl (B) 69 (*) 0 - 11 mg/dL   Comment:            LOWEST DETECTABLE LIMIT FOR     SERUM ALCOHOL IS 11 mg/dL     FOR MEDICAL PURPOSES ONLY  RAPID STREP SCREEN     Status: None   Collection Time    02/16/13 12:36 AM      Result Value Range   Streptococcus, Group A Screen (Direct) NEGATIVE  NEGATIVE   Comment: (NOTE)     A Rapid Antigen test may result negative if the antigen level in the     sample is below the detection level of this test. The FDA has not     cleared this test as a stand-alone test therefore the rapid antigen     negative result has reflexed to a Group A Strep culture.  CULTURE, GROUP A STREP     Status: None   Collection Time    02/16/13 12:36 AM      Result Value Range   Specimen Description THROAT  Special Requests NONE     Culture       Value: NO SUSPICIOUS COLONIES, CONTINUING TO HOLD     Performed at [facility]   Report Status PENDING     Psychological Evaluations:  Assessment:   DSM5:  Substance/Addictive Disorders:  Alcohol Related Disorder - Mild (305.00), Cannabis Use Disorder - Moderate 9304.30) and Opioid Disorder - Severe (304.00) Depressive Disorders:  Major Depressive Disorder - Severe (296.23)  AXIS I:  Alcohol Abuse, Anxiety Disorder NOS, Major Depression, Recurrent severe and Substance Abuse AXIS II:  Deferred AXIS III:   Past Medical History  Diagnosis Date  . Depression   . Chronic abdominal pain   . Anxiety   . Tourette disease   . Hepatitis C   . Alcohol dependence   . Opiate dependence    AXIS IV:  other psychosocial or environmental problems, problems related to social environment and problems with primary support group AXIS V:  41-50 serious symptoms  Treatment Plan/Recommendations:  Plan:  Review of chart, vital signs, medications, and notes. 1-Admit for crisis  management and stabilization.  Estimated length of stay 5-7 days past his current stay of 1 2-Individual and group therapy encouraged 3-Medication management for depression and anxiety to reduce current symptoms to base line and improve the patient's overall level of functioning:  Medications reviewed with the patient and she stated no untoward effects, home medications in place and vistaril increased 4-Coping skills for depression, substance abuse, and anxiety developing-- 5-Continue crisis stabilization and management 6-Address health issues--monitoring vital signs, stable  7-Treatment plan in progress to prevent relapse of depression, substance abuse, and anxiety 8-Psychosocial education regarding relapse prevention and self-care 9-Health care follow up as needed for any health concerns  10-Call for consult with hospitalist for additional specialty patient services as needed.  Treatment Plan Summary: Daily contact with patient to assess and evaluate symptoms and progress in treatment Medication management Current Medications:  Current Facility-Administered Medications  Medication Dose Route Frequency Provider Last Rate Last Dose  . alum & mag hydroxide-simeth (MAALOX/MYLANTA) 200-200-20 MG/5ML suspension 30 mL  30 mL Oral Q4H PRN Meghan Blankmann, NP      . desvenlafaxine (PRISTIQ) 24 hr tablet 50 mg  50 mg Oral Daily Meghan Blankmann, NP   50 mg at 02/17/13 1001  . hydrOXYzine (ATARAX/VISTARIL) tablet 100 mg  100 mg Oral QHS Meghan Blankmann, NP      . hydrOXYzine (ATARAX/VISTARIL) tablet 25 mg  25 mg Oral TID PRN Waylan Boga, NP      . magnesium hydroxide (MILK OF MAGNESIA) suspension 30 mL  30 mL Oral Daily PRN Meghan Blankmann, NP      . nicotine (NICODERM CQ - dosed in mg/24 hours) 21 mg/24hr patch             Observation Level/Precautions:  15 minute checks  Laboratory:  completed, reviewed, stable  Psychotherapy:  Individual and group therapy  Medications:  Pristiq, vistaril   Consultations:  None  Discharge Concerns:  None  Estimated LOS:  5-7 days  Other:     I certify that inpatient services furnished can reasonably be expected to improve the patient's condition.   Waylan Boga, PMH-NP 1/17/201510:22 AM I have examined the patient and agreed with the findings of H&P and treatment plan.

## 2013-02-17 NOTE — [facility] Group Notes (Signed)
[facility] Group Notes:  (Clinical Social Work)  02/17/2013   3:00-4:00PM  Summary of Progress/Problems:   The main focus of today's process group was for the patient to identify ways in which they have sabotaged their own mental health wellness/recovery.  Motivational interviewing and a handout were used to explore the benefits and costs of their self-sabotaging behavior as well as the benefits and costs of changing this behavior.  The Stages of Change were explained to the group using a handout, and patients identified where they are with regard to changing self-defeating behaviors.  The patient expressed he self-sabotages with internalizing arguments, which he defined as "not responding in arguments, just shutting down until I explode."  He said he has always been an introvert.  He does not necessarily see his behavior as a bad thing because it has replaced his former use of drugs and alcohol to deal with conflict.  He contradicted his own statements and was confusing at times for CSW to follow.  Type of Therapy:  Process Group  Participation Level:  Active  Participation Quality:  Attentive  Affect:  Depressed and Flat  Cognitive:  Oriented  Insight:  Developing/Improving  Engagement in Therapy:  Improving  Modes of Intervention:  Education, Motivational Interviewing   Ambrose MantleMareida Grossman-Orr, LCSW 02/17/2013, 4:00pm

## 2013-02-18 DIAGNOSIS — F411 Generalized anxiety disorder: Secondary | ICD-10-CM | POA: Diagnosis present

## 2013-02-18 MED ORDER — BUSPIRONE HCL 10 MG PO TABS
10.0000 mg | ORAL_TABLET | Freq: Three times a day (TID) | ORAL | Status: DC
  Administered 2013-02-18 – 2013-02-20 (×7): 10 mg via ORAL
  Filled 2013-02-18: qty 1
  Filled 2013-02-18: qty 30
  Filled 2013-02-18: qty 1
  Filled 2013-02-18: qty 30
  Filled 2013-02-18: qty 1
  Filled 2013-02-18: qty 30
  Filled 2013-02-18 (×2): qty 1
  Filled 2013-02-18 (×2): qty 2
  Filled 2013-02-18 (×4): qty 1

## 2013-02-18 MED ORDER — NICOTINE POLACRILEX 2 MG MT GUM
CHEWING_GUM | OROMUCOSAL | Status: AC
  Administered 2013-02-18: 2 mg via ORAL
  Filled 2013-02-18: qty 1

## 2013-02-18 MED ORDER — FLUVOXAMINE MALEATE 50 MG PO TABS
25.0000 mg | ORAL_TABLET | Freq: Every day | ORAL | Status: DC
  Administered 2013-02-18 – 2013-02-19 (×2): 25 mg via ORAL
  Filled 2013-02-18 (×2): qty 1
  Filled 2013-02-18: qty 10
  Filled 2013-02-18: qty 1

## 2013-02-18 NOTE — Progress Notes (Signed)
Psychoeducational Group Note  Date: 02/18/2013 Time:  0930  Group Topic/Focus:  Gratefulness:  The focus of this group is to help patients identify what two things they are most grateful for in their lives. What helps ground them and to center them on their work to their recovery.  Participation Level:  Active  Participation Quality:  Appropriate  Affect:  Appropriate  Cognitive:  Oriented  Insight:  Improving  Engagement in Group:  Engaged  Additional Comments:    Jeff Browning A  

## 2013-02-18 NOTE — Progress Notes (Signed)
[name] attends his groups, takes his emds and is engaged in his recovery as evidenced by the fact that he processes his feelings and unhealthy behaviors , openly coorelating physiol;ogy with new healthy behavior.   A Per his request, he is started on buspar for qanxiety and he tolerates this well. He deneis any withdrawal symptoms and noe are observed by this Clinical research associatewriter.   R Safety is in place and poc moves forward.

## 2013-02-18 NOTE — [facility] Counselor (Signed)
Adult Psychosocial Assessment Update Interdisciplinary Team  Previous Behavior Health Hospital admissions/discharges:  Admissions Discharges  Date: 10/03/12 Date: 10/06/12  Date: Date:  Date: Date:  Date: Date:  Date: Date:   Changes since the last Psychosocial Assessment (including adherence to outpatient mental health and/or substance abuse treatment, situational issues contributing to decompensation and/or relapse). Patient remains living with significant other and is now a father to child born 11/25/12  Patient has been off medications for approximately 1.5 months and reports with new job  [facility] for [facility]  Patient has since lost job and continues to use substances on a reduced basis.  Pt currently using a 12 pk beer weekly (vs daily) and THC use continues daily.      Discharge Plan 1. Will you be returning to the same living situation after discharge?   Yes:X No:      If no, what is your plan?           2. Would you like a referral for services when you are discharged? Yes: X   If yes, for what services?  No:       Patient acknowledges need for followup for medication management and is currently   Willing to focus on therapy and obtaining GED.      Summary and Recommendations (to be completed by the evaluator) Patient is a 28 YO single unemployed caucasian male admitted with diagnosis of   Major Depression, Recurrent, Severe with suicidal ideation.  Patient will benefit from   Crisis stabilization, medication evaluation, therapy groups for processing thoughts/feelings/experiences, psycho ed groups for increasing coping skills, and aftercare planning                   Signature:  Clide DalesHarrill, Catherine Campbell, 02/18/2013 8:55 AM

## 2013-02-18 NOTE — Progress Notes (Signed)
[facility] Group Notes:  (Nursing/MHT/Case Management/Adjunct)  Date:  02/18/2013  Time:  11:43 PM  Type of Therapy:  Group Therapy  Participation Level:  Active  Participation Quality:  Appropriate  Affect:  Appropriate  Cognitive:  Appropriate  Insight:  Appropriate  Engagement in Group:  Engaged  Modes of Intervention:  Discussion  Summary of Progress/Problems:The patient expressed that in group today to stop thinking negative and think more positive.  Octavio Mannshigpen, Eddis Pingleton Lee 02/18/2013, 11:43 PM

## 2013-02-18 NOTE — Progress Notes (Signed)
Psychoeducational Group Note  Date:  02/18/2013 Time:  1015  Group Topic/Focus:  Making Healthy Choices:   The focus of this group is to help patients identify negative/unhealthy choices they were using prior to admission and identify positive/healthier coping strategies to replace them upon discharge.  Participation Level:  Active  Participation Quality:  Appropriate  Affect:  Appropriate  Cognitive:  Oriented  Insight:  Engaged  Engagement in Group:  Improving  Additional Comments:    Danity Schmelzer A 02/18/2013  

## 2013-02-18 NOTE — [facility] Group Notes (Signed)
[facility] Group Notes:  (Clinical Social Work)  02/18/2013   1:15-2:15PM  Summary of Progress/Problems:  The main focus of today's process group was to   identify the patient's current support system and decide on other supports that can be put in place.  The picture on workbook was used to discuss why additional supports are needed.  An emphasis was placed on using counselor, doctor, therapy groups, 12-step groups, and problem-specific support groups to expand supports.   The patient expressed full comprehension of the concepts presented, and agreed that there is a need to add more supports.  The patient reported a willingness to add himself as a self-support and stated this is the first time he has ever been willing to do that.  He stated that he needs to improve his self esteem in order to take as his own support, and that he thinks small successes will help such as enrolling in school to get his GED.  He talked some about his self-mutilation habits since age 28 and said that he has "exasperated" all his supports like friends and family because "you can only cry wolf so often."  Type of Therapy:  Process Group  Participation Level:  Active  Participation Quality:  Attentive and Sharing  Affect:  Blunted and Disheveled  Cognitive:  Appropriate and Oriented  Insight:  Engaged  Engagement in Therapy:  Engaged  Modes of Intervention:  Education,  Support and [employer] FoodsProcessing  Jeff Wescoat Grossman-Orr, LCSW 02/18/2013, 4:00pm

## 2013-02-18 NOTE — Progress Notes (Signed)
Patient came to medication window and requested something to help with his anxiety. Patient received visteril and also requested nicotine gum which he received. Patient reports his anxiety is one of his main reasons for coming in to get help. He reports that he used to take xanax but with his substance abuse history he would like to take something else other than xanax once discharged. Writer offered support and encouraged him to speak with the doctor concerning this matter. He currently denies hi/a/v hallucinations but has passive si and verbally contracts for safety. Safety maintained on unit with 15 min checks.

## 2013-02-18 NOTE — Progress Notes (Signed)
[facility] MD Progress Note  02/18/2013 10:55 AM Jeff Browning  MRN:  [id number] [facility]  Patient complains of overwhelming anxiety, especially social anxiety.  He has been on benzodiazepines since his teenage years for this condition and abusing Xanax; no anxiety coping skills at this time.  Buspar and Luvox started for his anxiety.  Sleep was "fair", appetite is "good".  He does attend and participate in groups despite his anxiety, encouragement given. Diagnosis:   DSM5:  Substance/Addictive Disorders:  Alcohol Related Disorder - Severe (303.90), Alcohol Intoxication with Use Disorder - Severe (F10.229) and Alcohol Withdrawal (291.81) Depressive Disorders:  Major Depressive Disorder - Severe (296.23)  Axis I: Anxiety Disorder NOS, Major Depression, Recurrent severe and Substance Abuse Axis II: Deferred Axis III:  Past Medical History  Diagnosis Date  . Depression   . Chronic abdominal pain   . Anxiety   . Tourette disease   . Hepatitis C   . Alcohol dependence   . Opiate dependence    Axis IV: other psychosocial or environmental problems, problems related to social environment and problems with primary support group Axis V: 41-50 serious symptoms  ADL's:  Intact  Sleep: Fair  Appetite:  Good  Suicidal Ideation:  Denies  Homicidal Ideation:  Denies   Psychiatric Specialty Exam: Review of Systems  Constitutional: Negative.   HENT: Negative.   Eyes: Negative.   Respiratory: Negative.   Cardiovascular: Negative.   Gastrointestinal: Negative.   Genitourinary: Negative.   Musculoskeletal: Negative.   Skin: Negative.   Neurological: Negative.   Endo/Heme/Allergies: Negative.   Psychiatric/Behavioral: Positive for depression and substance abuse. The patient is nervous/anxious.     Blood pressure 112/79, pulse 78, temperature 97.9 F (36.6 C), temperature source Oral, resp. rate 18, height 5\' 8"  (1.727 m), weight 74.844 kg (165 lb).Body mass index is 25.09 kg/(m^2).   General Appearance: Dishelved  Eye Contact::  Fair  Speech:  Normal Rate  Volume:  Normal  Mood:  Anxious and Depressed  Affect:  Congruent  Thought Process:  Coherent  Orientation:  Full (Time, Place, and Person)  Thought Content:  Rumination  Suicidal Thoughts:  No  Homicidal Thoughts:  No  Memory:  Immediate;   Fair Recent;   Fair Remote;   Fair  Judgement:  Poor  Insight:  Lacking  Psychomotor Activity:  Increased  Concentration:  Fair  Recall:  Fair  Akathisia:  No  Handed:  Right  AIMS (if indicated):     Assets:  Leisure Time Physical Health Resilience  Sleep:  Number of Hours: 6.25   Current Medications: Current Facility-Administered Medications  Medication Dose Route Frequency Provider Last Rate Last Dose  . alum & mag hydroxide-simeth (MAALOX/MYLANTA) 200-200-20 MG/5ML suspension 30 mL  30 mL Oral Q4H PRN Meghan Blankmann, NP      . busPIRone (BUSPAR) tablet 10 mg  10 mg Oral TID Nanine MeansJamison Lord, NP      . chlordiazePOXIDE (LIBRIUM) capsule 25 mg  25 mg Oral Q6H PRN Nanine MeansJamison Lord, NP      . chlordiazePOXIDE (LIBRIUM) capsule 25 mg  25 mg Oral QID Nanine MeansJamison Lord, NP   25 mg at 02/18/13 [id number]   Followed by  . chlordiazePOXIDE (LIBRIUM) capsule 25 mg  25 mg Oral TID Nanine MeansJamison Lord, NP   25 mg at 02/17/13 1438   Followed by  . [START ON 02/19/2013] chlordiazePOXIDE (LIBRIUM) capsule 25 mg  25 mg Oral BH-qamhs Nanine MeansJamison Lord, NP       Followed by  . [START  ON 02/21/2013] chlordiazePOXIDE (LIBRIUM) capsule 25 mg  25 mg Oral Daily Nanine Means, NP      . desvenlafaxine (PRISTIQ) 24 hr tablet 50 mg  50 mg Oral Daily Meghan Blankmann, NP   50 mg at 02/18/13 0804  . fluvoxaMINE (LUVOX) tablet 25 mg  25 mg Oral QHS Nanine Means, NP      . hydrOXYzine (ATARAX/VISTARIL) tablet 100 mg  100 mg Oral QHS Meghan Blankmann, NP   100 mg at 02/17/13 2108  . hydrOXYzine (ATARAX/VISTARIL) tablet 25 mg  25 mg Oral TID PRN Nanine Means, NP   25 mg at 02/18/13 1041  . loperamide (IMODIUM) capsule  2-4 mg  2-4 mg Oral PRN Nanine Means, NP      . magnesium hydroxide (MILK OF MAGNESIA) suspension 30 mL  30 mL Oral Daily PRN Kendrick Fries, NP      . multivitamin with minerals tablet 1 tablet  1 tablet Oral Daily Nanine Means, NP   1 tablet at 02/18/13 0805  . nicotine polacrilex (NICORETTE) 2 MG gum           . nicotine polacrilex (NICORETTE) gum 2 mg  2 mg Oral PRN Nehemiah Settle, MD   2 mg at 02/17/13 2032  . ondansetron (ZOFRAN-ODT) disintegrating tablet 4 mg  4 mg Oral Q6H PRN Nanine Means, NP      . thiamine (B-1) injection 100 mg  100 mg Intramuscular Once Nanine Means, NP      . thiamine (VITAMIN B-1) tablet 100 mg  100 mg Oral Daily Nanine Means, NP   100 mg at 02/18/13 0805    Lab Results: No results found for this or any previous visit (from the past 48 hour(s)).  Physical Findings: AIMS: Facial and Oral Movements Muscles of Facial Expression: None, normal Lips and Perioral Area: None, normal Jaw: None, normal Tongue: None, normal,Extremity Movements Upper (arms, wrists, hands, fingers): None, normal Lower (legs, knees, ankles, toes): None, normal, Trunk Movements Neck, shoulders, hips: None, normal, Overall Severity Severity of abnormal movements (highest score from questions above): None, normal Incapacitation due to abnormal movements: None, normal, Dental Status Current problems with teeth and/or dentures?: No Does patient usually wear dentures?: No  CIWA:  CIWA-Ar Total: 1 COWS:  COWS Total Score: 0  Treatment Plan Summary: Daily contact with patient to assess and evaluate symptoms and progress in treatment Medication management  Plan:  Review of chart, vital signs, medications, and notes. 1-Individual and group therapy 2-Medication management for depression and anxiety:  Medications reviewed with the patient and Buspar 10 mg TID for anxiety, Luvox for anxiety and depression daily 3-Coping skills for depression, anxiety, and alcohol/benzodiazepine  dependency 4-Continue crisis stabilization and management 5-Address health issues--monitoring vital signs, stable 6-Treatment plan in progress to prevent relapse of depression, alcohol use, and anxiety  Medical Decision Making Problem Points:  Established problem, stable/improving (1) and Review of psycho-social stressors (1) Data Points:  Review of new medications or change in dosage (2)  I certify that inpatient services furnished can reasonably be expected to improve the patient's condition.   Nanine Means, PMH-NP 02/18/2013, 10:55 AM I agree with findings and treatment plan of this patient

## 2013-02-18 NOTE — Progress Notes (Signed)
[facility] Group Notes:  (Nursing/MHT/Case Management/Adjunct)  Date:  02/18/2013  Time:  5:01 PM  Type of Therapy:  Psychoeducational Skills  Participation Level:  Minimal  Participation Quality:  Appropriate and Attentive  Affect:  Appropriate  Cognitive:  Appropriate  Insight:  Appropriate  Engagement in Group:  Engaged  Modes of Intervention:  Activity  Summary of Progress/Problems: Pts did an activity where they wrote down 5 unique qualities about themselves and had to guess who those qualities belonged to. Pts were able to share things they wouldn't normally have shared and learn about their peers. 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

## 2013-02-19 NOTE — Progress Notes (Signed)
Adult Psychoeducational Group Note  Date:  02/19/2013 Time:  10:30 PM  Group Topic/Focus:  Wrap-Up Group:   The focus of this group is to help patients review their daily goal of treatment and discuss progress on daily workbooks.  Participation Level:  Active  Participation Quality:  Appropriate  Affect:  Appropriate  Cognitive:  Appropriate  Insight: Appropriate  Engagement in Group:  Engaged  Modes of Intervention:  Support  Additional Comments:  Pt stated that he was glad that he was able to see the doctor and that he had learned a lot   Jeff Browning 02/19/2013, 10:30 PM

## 2013-02-19 NOTE — Progress Notes (Signed)
Patient ID: Jeff Browning, male   DOB: 1985/03/04, 28 y.o.   MRN: [id number] [facility]  02/19/2013 1:29 PM Jeff Browning  MRN:  [id number]  Subjective:  Patient was seen and chart reviewed. Patient was admitted with the major depressive disorder, generalized anxiety disorder, alcohol intoxication, marijuana abuse and the suicidal ideation and unable to contract for safety. Patient reportedly living with his girlfriend and her family and has a younger child. Patient has a history of self-injurious behaviors, suicidal ideations, assault charges in history of heroin abuse versus dependence. Patient has been refusing to take Librium detox protocol compliant with his medication hydroxyzine, Buspar and Luvox. Patient was upset and started yelling when he was told he needed to cooperate and stay in hospital and he becomes improved. Patient denied disturbance of sleep and appetite. He does attend and participate in groups despite anxiety, encouragement. Diagnosis:   DSM5:  Substance/Addictive Disorders:  Alcohol Related Disorder - Severe (303.90), Alcohol Intoxication with Use Disorder - Severe (F10.229) and Alcohol Withdrawal (291.81) Depressive Disorders:  Major Depressive Disorder - Severe (296.23)  Axis I: Anxiety Disorder NOS, Major Depression, Recurrent severe and Substance Abuse Axis II: Deferred Axis III:  Past Medical History  Diagnosis Date  . Depression   . Chronic abdominal pain   . Anxiety   . Tourette disease   . Hepatitis C   . Alcohol dependence   . Opiate dependence    Axis IV: other psychosocial or environmental problems, problems related to social environment and problems with primary support group Axis V: 41-50 serious symptoms  ADL's:  Intact  Sleep: Fair  Appetite:  Good  Suicidal Ideation:  Denies  Homicidal Ideation:  Denies   Psychiatric Specialty Exam: Review of Systems  Constitutional: Negative.   HENT: Negative.   Eyes: Negative.    Respiratory: Negative.   Cardiovascular: Negative.   Gastrointestinal: Negative.   Genitourinary: Negative.   Musculoskeletal: Negative.   Skin: Negative.   Neurological: Negative.   Endo/Heme/Allergies: Negative.   Psychiatric/Behavioral: Positive for depression and substance abuse. The patient is nervous/anxious.     Blood pressure 106/70, pulse 69, temperature 97.7 F (36.5 C), temperature source Oral, resp. rate 16, height 5\' 8"  (1.727 m), weight 74.844 kg (165 lb).Body mass index is 25.09 kg/(m^2).  General Appearance: Dishelved  Eye Contact::  Fair  Speech:  Normal Rate  Volume:  Normal  Mood:  Anxious and Depressed  Affect:  Congruent  Thought Process:  Coherent  Orientation:  Full (Time, Place, and Person)  Thought Content:  Rumination  Suicidal Thoughts:  No  Homicidal Thoughts:  No  Memory:  Immediate;   Fair Recent;   Fair Remote;   Fair  Judgement:  Poor  Insight:  Lacking  Psychomotor Activity:  Increased  Concentration:  Fair  Recall:  Fair  Akathisia:  No  Handed:  Right  AIMS (if indicated):     Assets:  Leisure Time Physical Health Resilience  Sleep:  Number of Hours: 6.5   Current Medications: Current Facility-Administered Medications  Medication Dose Route Frequency Provider Last Rate Last Dose  . alum & mag hydroxide-simeth (MAALOX/MYLANTA) 200-200-20 MG/5ML suspension 30 mL  30 mL Oral Q4H PRN Meghan Blankmann, NP      . busPIRone (BUSPAR) tablet 10 mg  10 mg Oral TID Nanine MeansJamison Lord, NP   10 mg at 02/19/13 1206  . chlordiazePOXIDE (LIBRIUM) capsule 25 mg  25 mg Oral Q6H PRN Nanine MeansJamison Lord, NP      .  chlordiazePOXIDE (LIBRIUM) capsule 25 mg  25 mg Oral TID Nanine Means, NP   25 mg at 02/18/13 1722   Followed by  . chlordiazePOXIDE (LIBRIUM) capsule 25 mg  25 mg Oral BH-qamhs Nanine Means, NP       Followed by  . [START ON 02/21/2013] chlordiazePOXIDE (LIBRIUM) capsule 25 mg  25 mg Oral Daily Nanine Means, NP      . desvenlafaxine (PRISTIQ) 24 hr  tablet 50 mg  50 mg Oral Daily Meghan Blankmann, NP   50 mg at 02/19/13 0855  . fluvoxaMINE (LUVOX) tablet 25 mg  25 mg Oral QHS Nanine Means, NP   25 mg at 02/18/13 2113  . hydrOXYzine (ATARAX/VISTARIL) tablet 100 mg  100 mg Oral QHS Meghan Blankmann, NP   100 mg at 02/18/13 2113  . hydrOXYzine (ATARAX/VISTARIL) tablet 25 mg  25 mg Oral TID PRN Nanine Means, NP   25 mg at 02/19/13 0857  . loperamide (IMODIUM) capsule 2-4 mg  2-4 mg Oral PRN Nanine Means, NP      . magnesium hydroxide (MILK OF MAGNESIA) suspension 30 mL  30 mL Oral Daily PRN Kendrick Fries, NP      . multivitamin with minerals tablet 1 tablet  1 tablet Oral Daily Nanine Means, NP   1 tablet at 02/19/13 0855  . nicotine polacrilex (NICORETTE) gum 2 mg  2 mg Oral PRN Nehemiah Settle, MD   2 mg at 02/19/13 0857  . ondansetron (ZOFRAN-ODT) disintegrating tablet 4 mg  4 mg Oral Q6H PRN Nanine Means, NP      . thiamine (B-1) injection 100 mg  100 mg Intramuscular Once Nanine Means, NP      . thiamine (VITAMIN B-1) tablet 100 mg  100 mg Oral Daily Nanine Means, NP   100 mg at 02/19/13 1610    Lab Results: No results found for this or any previous visit (from the past 48 hour(s)).  Physical Findings: AIMS: Facial and Oral Movements Muscles of Facial Expression: None, normal Lips and Perioral Area: None, normal Jaw: None, normal Tongue: None, normal,Extremity Movements Upper (arms, wrists, hands, fingers): None, normal Lower (legs, knees, ankles, toes): None, normal, Trunk Movements Neck, shoulders, hips: None, normal, Overall Severity Severity of abnormal movements (highest score from questions above): None, normal Incapacitation due to abnormal movements: None, normal, Dental Status Current problems with teeth and/or dentures?: No Does patient usually wear dentures?: No  CIWA:  CIWA-Ar Total: 1 COWS:  COWS Total Score: 0  Treatment Plan Summary: Daily contact with patient to assess and evaluate symptoms and  progress in treatment Medication management  Plan:  Review of chart, vital signs, medications, and notes. 1-Individual and group therapy 2-Medication management for depression and anxiety:   Medications reviewed with the patient and Buspar 10 mg TID for anxiety, Luvox for anxiety and depression daily and will continue to offer Librium detox protocol as scheduled 3-Coping skills for depression, anxiety, and alcohol/benzodiazepine dependency 4-Continue crisis stabilization and management 5-Address health issues--monitoring vital signs, stable 6-Treatment plan in progress to prevent relapse of depression, alcohol use, and anxiety  Medical Decision Making Problem Points:  Established problem, stable/improving (1) and Review of psycho-social stressors (1) Data Points:  Review of new medications or change in dosage (2)  I certify that inpatient services furnished can reasonably be expected to improve the patient's condition.   Rockney Grenz,JANARDHAHA R. 02/19/2013, 1:29 PM

## 2013-02-19 NOTE — Tx Team (Signed)
Interdisciplinary Treatment Plan Update (Adult)  Date: 02/19/2013  Time Reviewed:  9:45 AM  Progress in Treatment: Attending groups: Yes Participating in groups:  Yes Taking medication as prescribed:  Yes Tolerating medication:  Yes Family/Significant othe contact made: CSW assessing  Patient understands diagnosis:  Yes Discussing patient identified problems/goals with staff:  Yes Medical problems stabilized or resolved:  Yes Denies suicidal/homicidal ideation: Yes Issues/concerns per patient self-inventory:  Yes Other:  New problem(s) identified: N/A  Discharge Plan or Barriers: CSW assessing for appropriate referrals.  Reason for Continuation of Hospitalization: Anxiety Depression Medication Stabilization  Comments: N/A  Estimated length of stay: 3-5 days  For review of initial/current patient goals, please see plan of care.  Attendees: Patient:     Family:     Physician:  Dr. Johnalagadda 02/19/2013 10:07 AM   Nursing:   Christa Dopson, RN 02/19/2013 10:07 AM   Clinical Social Worker:  Kohei Antonellis Horton, LCSW 02/19/2013 10:07 AM   Other: Conrad Withrow, PA 02/19/2013 10:07 AM   Other:  Valerie Noch, care coordination 02/19/2013 10:07 AM   Other:  Beverly Knight, RN 02/19/2013 10:07 AM   Other:  Carol Davis, RN 02/19/2013 10:07 AM   Other:    Other:    Other:    Other:    Other:    Other:     Scribe for Treatment Team:   Horton, Gracelynne Benedict Nicole, 02/19/2013 10:07 AM    

## 2013-02-19 NOTE — Progress Notes (Signed)
Patient ID: Jeff Browning, male   DOB: Oct 06, 1985, 28 y.o.   MRN: [id number] D- Patiet got very upset and yelled angrily after seeing Md today.  He was able to calm himself quickly and talked with staff.  Patient was pleased that he was not violent while angry.  "I didn't hit anyone or a wall"  Talked with patient about his anger and his feeling that he was not heard.  Patient says he has been clean for 6 months.  He denies SI/HI.  He says he is concerned about his wife who is handing sick child and infant  at home.  Patient says he signed a 72 hour notice shortly after arriving 02/19/13. A- Patient informed  no copy found in chart.  Offered patient 72 hour notice to sign at this time but he declined.R-    He is attending groups and interacting with peers.

## 2013-02-19 NOTE — [facility] Group Notes (Signed)
[facility] LCSW Group Therapy  02/19/2013  1:15 PM   Type of Therapy:  Group Therapy  Participation Level:  Active  Participation Quality:  Attentive, Sharing and Supportive  Affect:  Depressed and Agitated   Cognitive:  Alert and Oriented  Insight:  Developing/Improving and Engaged  Engagement in Therapy:  Developing/Improving and Engaged  Modes of Intervention:  Clarification, Confrontation, Discussion, Education, Exploration, Limit-setting, Orientation, Problem-solving, Rapport Building, Dance movement psychotherapisteality Testing, Socialization and Support  Summary of Progress/Problems: Pt identified obstacles faced currently and processed barriers involved in overcoming these obstacles. Pt identified steps necessary for overcoming these obstacles and explored motivation (internal and external) for facing these difficulties head on. Pt further identified one area of concern in their lives and chose a goal to focus on for today.  Pt was agitated about his interaction with the MD earlier and wanted to talk about that.  Pt was able to discuss how his anger plays out in life, stating that he has been to jail a few times for assault charges.  Pt states that he wants to learn how to use his anger to benefit him, such as motivation.  Pt actively participated and was engaged in group discussion.    Reyes IvanChelsea Horton, LCSW 02/19/2013 2:56 PM

## 2013-02-19 NOTE — [facility] Suicide Risk Assessment (Signed)
[facility] INPATIENT:  Family/Significant Other Suicide Prevention Education  Suicide Prevention Education:  Patient Refusal for Family/Significant Other Suicide Prevention Education: The patient Jeff Browning has refused to provide written consent for family/significant other to be provided Family/Significant Other Suicide Prevention Education during admission and/or prior to discharge.  Physician notified.  SPE completed with pt. SPI pamphlet provided to pt and he was encouraged to share information with support network, ask questions, and talk about any concerns relating to SPE.   Smart, Klay Sobotka LCSWA  02/19/2013, 3:23 PM

## 2013-02-19 NOTE — [facility] Group Notes (Signed)
[facility]   02/19/2013 8:45 AM  Participation Quality:  Alert, Appropriate and Oriented  Mood/Affect:  Flat and Depressed  Depression Rating:  2  Anxiety Rating:  6-7  Thoughts of Suicide:  Pt denies SI/HI  Will you contract for safety?   Yes  Current AVH:  Pt denies  Plan for Discharge/Comments:  Pt attended discharge planning group and actively participated in group.  CSW provided pt with today's workbook.  [place].  Pt doesn't have follow up.  CSW will assess for appropriate referrals.  No further needs voiced by pt at this time.    Transportation Means: Pt reports access to transportation   Supports: No supports mentioned at this time  Reyes IvanChelsea Horton, LCSW 02/19/2013 9:50 AM

## 2013-02-19 NOTE — Progress Notes (Signed)
Recreation Therapy Notes  Date: 01.19.2015 Time: 3:00pm Location: [address] Dayroom   Group Topic: Coping Skills  Goal Area(s) Addresses:  Patient will identify benefit of using coping skill.  Patient will identify ability to positively change life by using coping skills.   Behavioral Response: Engaged, Appropriate  Intervention: Journaling  Activity: 40-20-10-5. Patients were asked to identify their reason for admission, using this as inspiration patients were asked to write about it in 40, 20, 10, 5, and 1 word(s).    Education: PharmacologistCoping Skills, Discharge Planning   Education Outcome: Acknowledges understanding   Clinical Observations/Feedback: Patient actively enaged in group activity. Patient shared he enjoyed this activity, despite feeling the act of writing was not helpful for him. Patient actively engaged in group discussion, bouncing ideas off of peers and supporting and encouraging peers as needed.   Marykay Lexenise L Zaidin Blyden, LRT/CTRS  Karizma Cheek L 02/19/2013 5:08 PM

## 2013-02-19 NOTE — Progress Notes (Signed)
Patient has been up and active on the unit, attended group this evening and is hopeful that the new medications he is started on will be helpful for his anxiety and social phobia.  Patient currently denies having pain, -si/hi/a/v hall. Support and encouragement offered, safety maintained on unit, will continue to monitor.

## 2013-02-20 MED ORDER — PNEUMOCOCCAL 13-VAL CONJ VACC IM SUSP
0.5000 mL | Freq: Once | INTRAMUSCULAR | Status: DC
  Filled 2013-02-20: qty 0.5

## 2013-02-20 MED ORDER — FLUVOXAMINE MALEATE 25 MG PO TABS: 25.0000 mg | ORAL_TABLET | Freq: Every day | ORAL | Status: DC

## 2013-02-20 MED ORDER — DESVENLAFAXINE SUCCINATE ER 50 MG PO TB24: 50.0000 mg | ORAL_TABLET | Freq: Every day | ORAL | Status: DC

## 2013-02-20 MED ORDER — INFLUENZA VAC SPLIT QUAD 0.5 ML IM SUSP
0.5000 mL | INTRAMUSCULAR | Status: AC
  Administered 2013-02-20: 0.5 mL via INTRAMUSCULAR
  Filled 2013-02-20: qty 0.5

## 2013-02-20 MED ORDER — TRAZODONE HCL 50 MG PO TABS
50.0000 mg | ORAL_TABLET | Freq: Every day | ORAL | Status: DC
  Filled 2013-02-20: qty 10

## 2013-02-20 MED ORDER — BUSPIRONE HCL 10 MG PO TABS: 10.0000 mg | ORAL_TABLET | Freq: Three times a day (TID) | ORAL | Status: DC

## 2013-02-20 MED ORDER — PNEUMOCOCCAL VAC POLYVALENT 25 MCG/0.5ML IJ INJ
0.5000 mL | INJECTION | Freq: Once | INTRAMUSCULAR | Status: AC
  Administered 2013-02-20: 0.5 mL via INTRAMUSCULAR

## 2013-02-20 NOTE — Progress Notes (Signed)
The focus of this group is to educate the patient on the purpose and policies of crisis stabilization and provide a format to answer questions about their admission.  The group details unit policies and expectations of patients while admitted.  Patient attended 0900 nurse education orientation group this morning.  Patient actively participated, appropriate affect, alert, appropriate insight and engagement.  Today patient will work on 3 goals for discharge.  

## 2013-02-20 NOTE — Progress Notes (Signed)
D: Pt is anxious in affect and mood. Pt denies that his anxiety is related to withdrawals. Pt is actually denying any withdrawal symptoms and has declined to take his Librium at this time or in the future. Pt reports some nausea that occupies  his anxiety. Pt is denying any SI/HI/AVH.  A: Writer administered scheduled and prn medications to pt. Continued support and availability as needed was extended to this pt. Staff continue to monitor pt with q7215min checks.  R: No adverse drug reactions noted. Pt's anxiety was decreased.  Pt receptive to treatment. Pt remains safe at this time.

## 2013-02-20 NOTE — Progress Notes (Signed)
[facility] Discharge Plan :  Will you be returning to the same living situation after discharge: Yes,  Patient is returning to his home. At discharge, do you have transportation home?:Yes,  Patient to arrange transporation. Do you have the ability to pay for your medications:Yes,  Patient has Medicaid.  Release of information consent forms completed and in the chart;  Patient's signature needed at discharge.  Patient to Follow up at: Follow-up Information   [facility] with [facility]  On 02/26/2013. (Appt. at 10:00AM with Tawanna Satracie Anthony for therapy. )    Contact information:   [facility] [address] [place], [place] [address] Phone: [phone] Fax: [phone]      [facility] On 02/21/2013. (Please go to [facility] walk in clinic on Wednesday, February 21, 2013 or any weekdday between 8AM-5:00 PM for medication management.)    Contact information:   [address] [place], [place] [address] Phone: [phone] Fax: [phone]      Patient denies SI/HI: Patient no longer endorsing SI/HI or other thoughts of self harm.   Safety Planning and Suicide Prevention discussed:  Reviewed with patient individually and SPE pamphlet provided.   Jeff Browning 02/20/2013, 10:13 AM

## 2013-02-20 NOTE — Progress Notes (Signed)
Patient ID: Jeff Browning, male   DOB: 30-Oct-1985, 28 y.o.   MRN: [id number] Patient is discharged ambulatory to ride home with his girlfriend.  He denies SI/HI.  He verbalizes understanding of his discharge meds and followup.  He was given scripts and a supply of meds.  He is hopeful for the future and appreciative of care given.

## 2013-02-20 NOTE — [facility] Suicide Risk Assessment (Signed)
Suicide Risk Assessment  Discharge Assessment     Demographic Factors:  Male, Adolescent or young adult, Caucasian, Low socioeconomic status and Unemployed  Mental Status Per Nursing Assessment::   On Admission:  NA  Current Mental Status by Physician: Mental Status Examination: Patient appeared as per his stated age, casually dressed, and fairly groomed, and maintaining good eye contact. Patient has good mood and his affect was constricted. He has normal rate, rhythm, and volume of speech. His thought process is linear and goal directed. Patient has denied suicidal, homicidal ideations, intentions or plans. Patient has no evidence of auditory or visual hallucinations, delusions, and paranoia. Patient has fair insight judgment and impulse control.  Loss Factors: NA  Historical Factors: Prior suicide attempts, Family history of mental illness or substance abuse and Impulsivity  Risk Reduction Factors:   Sense of responsibility to family, Religious beliefs about death, Living with another person, especially a relative, Positive social support, Positive therapeutic relationship and Positive coping skills or problem solving skills  Continued Clinical Symptoms:  Depression:   Recent sense of peace/wellbeing Alcohol/Substance Abuse/Dependencies Previous Psychiatric Diagnoses and Treatments  Cognitive Features That Contribute To Risk:  Polarized thinking    Suicide Risk:  Minimal: No identifiable suicidal ideation.  Patients presenting with no risk factors but with morbid ruminations; may be classified as minimal risk based on the severity of the depressive symptoms  Discharge Diagnoses:   AXIS I:  Major Depression, Recurrent severe, Substance Induced Mood Disorder and Alcohol dependence and opioid dependence AXIS II:  Deferred AXIS III:   Past Medical History  Diagnosis Date  . Depression   . Chronic abdominal pain   . Anxiety   . Tourette disease   . Hepatitis C   . Alcohol  dependence   . Opiate dependence    AXIS IV:  other psychosocial or environmental problems, problems related to social environment and problems with primary support group AXIS V:  61-70 mild symptoms  Plan Of Care/Follow-up recommendations:  Activity:  As tolerated Diet:  Regular  Is patient on multiple antipsychotic therapies at discharge:  No   Has Patient had three or more failed trials of antipsychotic monotherapy by history:  No  Recommended Plan for Multiple Antipsychotic Therapies: NA  Jeff Browning,Jeff Browning. 02/20/2013, 11:53 AM

## 2013-02-20 NOTE — Discharge Summary (Signed)
Physician Discharge Summary Note  Patient:  Jeff Browning is an 28 y.o., male MRN:  [id number] DOB:  [birth year] Patient phone:  [phone] (home)  Patient address:   [address] [place] [address],   Date of Admission:  02/16/2013 Date of Discharge: 02/20/2013  Reason for Admission:  Suicidal ideation, major depression   Discharge Diagnoses: Principal Problem:   Major depressive disorder, recurrent episode, severe, without mention of psychotic behavior Active Problems:   Opiate dependence   Alcohol dependence   Generalized anxiety disorder  Review of Systems  Constitutional: Negative.   HENT: Negative.   Eyes: Negative.   Respiratory: Negative.   Cardiovascular: Negative.   Gastrointestinal: Negative.   Genitourinary: Negative.   Musculoskeletal: Negative.   Skin: Negative.   Neurological: Negative.   Endo/Heme/Allergies: Negative.   Psychiatric/Behavioral: Positive for depression and substance abuse. Negative for suicidal ideas and hallucinations. The patient is nervous/anxious.    Substance/Addictive Disorders: Alcohol Related Disorder - Mild (305.00), Cannabis Use Disorder - Moderate 9304.30) and Opioid Disorder - Severe (304.00)  Depressive Disorders: Major Depressive Disorder - Severe (296.23)  AXIS I: Alcohol Abuse, Anxiety Disorder NOS, Major Depression, Recurrent severe and Substance Abuse   DSM5: Substance/Addictive Disorders:  Alcohol Related Disorder - Mild (305.00), Cannabis Use Disorder - Moderate 9304.30) and Opioid Disorder - Severe (304.00) Depressive Disorders:  Major Depressive Disorder - Severe (296.23)  Axis Diagnosis:   AXIS I:  Alcohol Abuse, Anxiety Disorder NOS, Major Depression, Recurrent severe and Substance Abuse AXIS II:  Deferred AXIS III:   Past Medical History  Diagnosis Date  . Depression   . Chronic abdominal pain   . Anxiety   . Tourette disease   . Hepatitis C   . Alcohol dependence   . Opiate dependence    AXIS  IV:  other psychosocial or environmental problems and problems related to social environment AXIS V:  61-70 mild symptoms  Level of Care:  OP  Hospital Course:   28 y.o. male. Patient presents to the [facility] complaining of suicidal ideation for the past 2 days. States depression comes and goes. Thinks about being dead. Can not identify any factors that would decrease suicide risk. Patient states he has attempted suicide three times in the past year by cutting his wrist. Feels "worthless". Symptoms include insomnia, isolating, guilt, and irritability. Admits to staying in bed, not bathing, and not grooming. Stopped taking medications 1.5 months ago when he ran out and did not return to psychiatrist. Patient smokes marijuana daily and drinks 6-8 beers every 2-3 days. He stopped using heroin on Memorial Day. Denies withdrawal symptoms. Has been using since he was 28 years old. Strong family history of substance abuse. Denies psychosis and homicidal ideation. History of assault charges 5 years ago. Pt participates in group and individual therapy.  During Hospitalization: Medications managed, psychoeducation, group and individual therapy. Pt currently denies SI, HI, and Psychosis. At discharge, pt reports decreased anxiety and depression, stating that his main anxiety right now is arising from staying in [facility] too long and his wife with 68mo old daughter and 38yr old daughter all home alone without help. However, pt states that he is willing to stay if we truly feel he needs to remain inpatient. Pt states that he does have a good supportive home environment and will followup with outpatient treatment. Affirms agreement with medication regimen and discharge plan. Denies other physical and psychological concerns at time of discharge.   Consults:  None  Significant Diagnostic  Studies:  None  Discharge Vitals:   Blood pressure 121/74, pulse 71, temperature 97.6 F (36.4 C), temperature source  Oral, resp. rate 16, height 5\' 8"  (1.727 m), weight 74.844 kg (165 lb). Body mass index is 25.09 kg/(m^2). Lab Results:   No results found for this or any previous visit (from the past 72 hour(s)).  Physical Findings: AIMS: Facial and Oral Movements Muscles of Facial Expression: None, normal Lips and Perioral Area: None, normal Jaw: None, normal Tongue: None, normal,Extremity Movements Upper (arms, wrists, hands, fingers): None, normal Lower (legs, knees, ankles, toes): None, normal, Trunk Movements Neck, shoulders, hips: None, normal, Overall Severity Severity of abnormal movements (highest score from questions above): None, normal Incapacitation due to abnormal movements: None, normal, Dental Status Current problems with teeth and/or dentures?: No Does patient usually wear dentures?: No  CIWA:  CIWA-Ar Total: 0 COWS:  COWS Total Score: 0  Psychiatric Specialty Exam: See Psychiatric Specialty Exam and Suicide Risk Assessment completed by Attending Physician prior to discharge.  Discharge destination:  Home  Is patient on multiple antipsychotic therapies at discharge:  No   Has Patient had three or more failed trials of antipsychotic monotherapy by history:  No  Recommended Plan for Multiple Antipsychotic Therapies: NA     Medication List       Indication   ALPRAZolam 1 MG tablet  Commonly known as:  XANAX  Take 1 mg by mouth at bedtime as needed for anxiety.      busPIRone 10 MG tablet  Commonly known as:  BUSPAR  Take 1 tablet (10 mg total) by mouth 3 (three) times daily.   Indication:  mood stabilization     desvenlafaxine 50 MG 24 hr tablet  Commonly known as:  PRISTIQ  Take 1 tablet (50 mg total) by mouth daily.   Indication:  mood stabilization     fluvoxaMINE 25 MG tablet  Commonly known as:  LUVOX  Take 1 tablet (25 mg total) by mouth at bedtime.   Indication:  mood stabilization     traZODone 50 MG tablet  Commonly known as:  DESYREL  Take 50 mg by  mouth at bedtime.            Follow-up Information   [facility]  On [facility]/26/2013. (Appt. at 10:00AM with Tawanna Sat for therapy. )    Contact information:   [facility] [address] [place], [place] [address] Phone: [phone] Fax: [phone]      Follow up with [facility] On 02/21/2013. (Please go to [facility] walk in clinic on Wednesday, February 21, 2013 or any weekdday between 8AM-5:00 PM for medication management.)    Contact information:   [address] [place], [place] [address] Phone: [phone] Fax: [phone]      Follow-up recommendations:  Activity:  As tolerated Diet:  Heart healthy with low sodium.  Comments:   Take all medications as prescribed. Keep all follow-up appointments as scheduled.  Do not consume alcohol or use illegal drugs while on prescription medications. Report any adverse effects from your medications to your primary care provider promptly.  In the event of recurrent symptoms or worsening symptoms, call 911, a crisis hotline, or go to the nearest emergency department for evaluation.   Total Discharge Time:  Greater than 30 minutes.  Signed: Beau Fanny, FNP-BC 02/20/2013, 2:49 PM  Patient was seen face-to-face for this psychiatric evaluation, admission and discharge suicide risk assessment and case discussed with a physician extender,  formulated treatment plan and discharge plan. Reviewed the information documented by physician extender and agree with the treatment plan.  Gail Vendetti,JANARDHAHA R. 02/20/2013 7:45 PM

## 2013-02-23 NOTE — Progress Notes (Signed)
Patient Discharge Instructions:  After Visit Summary (AVS):   Faxed to:  02/23/13 Discharge Summary Note:   Faxed to:  02/23/13 Psychiatric Admission Assessment Note:   Faxed to:  02/23/13 Suicide Risk Assessment - Discharge Assessment:   Faxed to:  02/23/13 Faxed/Sent to the Next Level Care provider:  02/23/13 Faxed to [facility] @ [phone] Faxed to [facility] @ [phone]  Jerelene ReddenSheena E Arena, 02/23/2013, 3:37 PM

## 2014-06-09 ENCOUNTER — Emergency Department (HOSPITAL_BASED_OUTPATIENT_CLINIC_OR_DEPARTMENT_OTHER)
Admission: EM | Admit: 2014-06-09 | Discharge: 2014-06-09 | Disposition: A | Discharge status: Home / Self Care | Payer: Medicaid Other | Attending: Emergency Medicine | Admitting: Emergency Medicine

## 2014-06-09 ENCOUNTER — Encounter (HOSPITAL_BASED_OUTPATIENT_CLINIC_OR_DEPARTMENT_OTHER): Payer: Self-pay | Admitting: *Deleted

## 2014-06-09 ENCOUNTER — Emergency Department (HOSPITAL_BASED_OUTPATIENT_CLINIC_OR_DEPARTMENT_OTHER): Payer: Medicaid Other

## 2014-06-09 DIAGNOSIS — F419 Anxiety disorder, unspecified: Secondary | ICD-10-CM | POA: Insufficient documentation

## 2014-06-09 DIAGNOSIS — R109 Unspecified abdominal pain: Secondary | ICD-10-CM | POA: Insufficient documentation

## 2014-06-09 DIAGNOSIS — Z8619 Personal history of other infectious and parasitic diseases: Secondary | ICD-10-CM | POA: Insufficient documentation

## 2014-06-09 DIAGNOSIS — R112 Nausea with vomiting, unspecified: Secondary | ICD-10-CM | POA: Insufficient documentation

## 2014-06-09 DIAGNOSIS — Z720 Tobacco use: Secondary | ICD-10-CM | POA: Diagnosis not present

## 2014-06-09 DIAGNOSIS — R630 Anorexia: Secondary | ICD-10-CM | POA: Diagnosis not present

## 2014-06-09 DIAGNOSIS — R197 Diarrhea, unspecified: Secondary | ICD-10-CM

## 2014-06-09 DIAGNOSIS — G8929 Other chronic pain: Secondary | ICD-10-CM | POA: Insufficient documentation

## 2014-06-09 DIAGNOSIS — E860 Dehydration: Secondary | ICD-10-CM | POA: Diagnosis not present

## 2014-06-09 DIAGNOSIS — Z79899 Other long term (current) drug therapy: Secondary | ICD-10-CM | POA: Diagnosis not present

## 2014-06-09 LAB — COMPREHENSIVE METABOLIC PANEL
ALBUMIN: 4.9 g/dL (ref 3.5–5.0)
ALT: 116 U/L — ABNORMAL HIGH (ref 17–63)
ANION GAP: 13 (ref 5–15)
AST: 59 U/L — ABNORMAL HIGH (ref 15–41)
Alkaline Phosphatase: 72 U/L (ref 38–126)
BUN: 16 mg/dL (ref 6–20)
CO2: 27 mmol/L (ref 22–32)
CREATININE: 0.87 mg/dL (ref 0.61–1.24)
Calcium: 9.7 mg/dL (ref 8.9–10.3)
Chloride: 97 mmol/L — ABNORMAL LOW (ref 101–111)
GFR calc Af Amer: >60 mL/min (ref >60)
GFR calc non Af Amer: >60 mL/min (ref >60)
Glucose, Bld: 126 mg/dL — ABNORMAL HIGH (ref 70–99)
Potassium: 3.5 mmol/L (ref 3.5–5.1)
SODIUM: 137 mmol/L (ref 135–145)
Total Bilirubin: 1.1 mg/dL (ref 0.3–1.2)
Total Protein: 8.1 g/dL (ref 6.5–8.1)

## 2014-06-09 LAB — CBC WITH DIFFERENTIAL/PLATELET
Basophils Absolute: 0.1 10*3/uL (ref 0.0–0.1)
Basophils Relative: 0 % (ref 0–1)
EOS PCT: 1 % (ref 0–5)
Eosinophils Absolute: 0.1 10*3/uL (ref 0.0–0.7)
HEMATOCRIT: 51.6 % (ref 39.0–52.0)
HEMOGLOBIN: 18.7 g/dL — AB (ref 13.0–17.0)
LYMPHS ABS: 2.2 10*3/uL (ref 0.7–4.0)
LYMPHS PCT: 16 % (ref 12–46)
MCH: 31.1 pg (ref 26.0–34.0)
MCHC: 36.2 g/dL — ABNORMAL HIGH (ref 30.0–36.0)
MCV: 85.7 fL (ref 78.0–100.0)
MONO ABS: 1 10*3/uL (ref 0.1–1.0)
MONOS PCT: 7 % (ref 3–12)
NEUTROS ABS: 10.4 10*3/uL — AB (ref 1.7–7.7)
Neutrophils Relative %: 76 % (ref 43–77)
Platelets: 294 10*3/uL (ref 150–400)
RBC: 6.02 MIL/uL — AB (ref 4.22–5.81)
RDW: 13.9 % (ref 11.5–15.5)
WBC: 13.7 10*3/uL — ABNORMAL HIGH (ref 4.0–10.5)

## 2014-06-09 LAB — URINALYSIS, ROUTINE W REFLEX MICROSCOPIC
Bilirubin Urine: NEGATIVE
GLUCOSE, UA: NEGATIVE mg/dL
Hgb urine dipstick: NEGATIVE
Ketones, ur: 40 mg/dL — AB
LEUKOCYTES UA: NEGATIVE
Nitrite: NEGATIVE
PH: 7.5 (ref 5.0–8.0)
Protein, ur: NEGATIVE mg/dL
SPECIFIC GRAVITY, URINE: 1.018 (ref 1.005–1.030)
Urobilinogen, UA: 0.2 mg/dL (ref 0.0–1.0)

## 2014-06-09 LAB — ETHANOL

## 2014-06-09 LAB — RAPID URINE DRUG SCREEN, HOSP PERFORMED
Amphetamines: NOT DETECTED
BARBITURATES: NOT DETECTED
BENZODIAZEPINES: NOT DETECTED
Cocaine: NOT DETECTED
OPIATES: NOT DETECTED
TETRAHYDROCANNABINOL: POSITIVE — AB

## 2014-06-09 LAB — LIPASE, BLOOD: LIPASE: 20 U/L — AB (ref 22–51)

## 2014-06-09 MED ORDER — SODIUM CHLORIDE 0.9 % IV BOLUS (SEPSIS)
1000.0000 mL | Freq: Once | INTRAVENOUS | Status: AC
  Administered 2014-06-09: 1000 mL via INTRAVENOUS

## 2014-06-09 MED ORDER — LORAZEPAM 2 MG/ML IJ SOLN
1.0000 mg | Freq: Once | INTRAMUSCULAR | Status: AC
  Administered 2014-06-09: 1 mg via INTRAVENOUS
  Filled 2014-06-09: qty 1

## 2014-06-09 MED ORDER — SODIUM CHLORIDE 0.9 % IV BOLUS (SEPSIS): 1000.0000 mL | Freq: Once | INTRAVENOUS | Status: DC

## 2014-06-09 MED ORDER — ONDANSETRON HCL 4 MG/2ML IJ SOLN
INTRAMUSCULAR | Status: AC
  Filled 2014-06-09: qty 2

## 2014-06-09 MED ORDER — PROMETHAZINE HCL 25 MG/ML IJ SOLN
25.0000 mg | Freq: Once | INTRAMUSCULAR | Status: AC
  Administered 2014-06-09: 25 mg via INTRAVENOUS
  Filled 2014-06-09: qty 1

## 2014-06-09 MED ORDER — HYOSCYAMINE SULFATE 0.125 MG SL SUBL
0.1250 mg | SUBLINGUAL_TABLET | Freq: Once | SUBLINGUAL | Status: AC
  Administered 2014-06-09: 0.125 mg via SUBLINGUAL
  Filled 2014-06-09: qty 1

## 2014-06-09 MED ORDER — ONDANSETRON HCL 4 MG PO TABS: 4.0000 mg | ORAL_TABLET | Freq: Four times a day (QID) | ORAL | Status: DC

## 2014-06-09 MED ORDER — PROMETHAZINE HCL 25 MG PO TABS: 25.0000 mg | ORAL_TABLET | Freq: Four times a day (QID) | ORAL | Status: DC | PRN

## 2014-06-09 MED ORDER — ONDANSETRON HCL 4 MG/2ML IJ SOLN
4.0000 mg | Freq: Once | INTRAMUSCULAR | Status: AC
  Administered 2014-06-09: 4 mg via INTRAVENOUS
  Filled 2014-06-09: qty 2

## 2014-06-09 MED ORDER — IOHEXOL 300 MG/ML  SOLN
100.0000 mL | Freq: Once | INTRAMUSCULAR | Status: AC | PRN
  Administered 2014-06-09: 100 mL via INTRAVENOUS

## 2014-06-09 MED ORDER — HYDROMORPHONE HCL 1 MG/ML IJ SOLN
1.0000 mg | Freq: Once | INTRAMUSCULAR | Status: AC
  Administered 2014-06-09: 1 mg via INTRAVENOUS
  Filled 2014-06-09: qty 1

## 2014-06-09 MED ORDER — IOHEXOL 300 MG/ML  SOLN
50.0000 mL | Freq: Once | INTRAMUSCULAR | Status: AC | PRN
  Administered 2014-06-09: 50 mL via ORAL

## 2014-06-09 NOTE — ED Notes (Signed)
Patient c/o nausea/vomiting since Thursday. Took zofran yesterday but no relief

## 2014-06-09 NOTE — ED Provider Notes (Signed)
CSN: [id number]     Arrival date & time 06/09/14  [id number] History   First MD Initiated Contact with Patient 06/09/14 [phone]     Chief Complaint  Patient presents with  . Nausea  . Emesis     (Consider location/radiation/quality/duration/timing/severity/associated sxs/prior Treatment) Patient is a 29 y.o. male presenting with vomiting. The history is provided by the patient.  Emesis Severity:  Severe Associated symptoms: abdominal pain, chills and diarrhea   Associated symptoms: no headaches    patient with nausea vomiting for the last few days. States he's been able to eat anything. Had a little diarrhea today but states he has had no bowel movement for around 2 days. Has had a history of chronic abdominal problems. Has a history of hepatitis C. Previous drug use was states besides marijuana and occasional alcohol he does not do that anymore. He has been actively vomiting in the ER. States he never followed up with anyone for his abdominal problems. No blood in emesis or stool. States he has had chills without fever. No relief with Zofran at home. Pain is crampy and in the mid abdomen.  Past Medical History  Diagnosis Date  . Depression   . Chronic abdominal pain   . Anxiety   . Tourette disease   . Hepatitis C   . Alcohol dependence   . Opiate dependence    History reviewed. No pertinent past surgical history. Family History  Problem Relation Age of Onset  . Ulcerative colitis Father    History  Substance Use Topics  . Smoking status: Current Every Day Smoker -- 1.00 packs/day    Types: Cigarettes  . Smokeless tobacco: Not on file  . Alcohol Use: 7.2 oz/week    12 Cans of beer per week     Comment: Daily     Review of Systems  Constitutional: Positive for chills and appetite change. Negative for activity change.  Eyes: Negative for pain.  Respiratory: Negative for chest tightness and shortness of breath.   Cardiovascular: Negative for chest pain and leg swelling.   Gastrointestinal: Positive for nausea, vomiting, abdominal pain and diarrhea.  Genitourinary: Negative for flank pain.  Musculoskeletal: Negative for back pain and neck stiffness.  Skin: Negative for rash.  Neurological: Positive for weakness. Negative for numbness and headaches.      Allergies  Acetaminophen  Home Medications   Prior to Admission medications   Medication Sig Start Date End Date Taking? Authorizing Provider  ALPRAZolam Prudy Feeler(XANAX) 1 MG tablet Take 1 mg by mouth at bedtime as needed for anxiety.    Historical Provider, MD  busPIRone (BUSPAR) 10 MG tablet Take 1 tablet (10 mg total) by mouth 3 (three) times daily. 02/20/13   Beau FannyJohn C Withrow, FNP  desvenlafaxine (PRISTIQ) 50 MG 24 hr tablet Take 1 tablet (50 mg total) by mouth daily. 02/20/13   Beau FannyJohn C Withrow, FNP  fluvoxaMINE (LUVOX) 25 MG tablet Take 1 tablet (25 mg total) by mouth at bedtime. 02/20/13   Beau FannyJohn C Withrow, FNP  ondansetron (ZOFRAN) 4 MG tablet Take 1 tablet (4 mg total) by mouth every 6 (six) hours. 06/09/14   Tilden FossaElizabeth Rees, MD  promethazine (PHENERGAN) 25 MG tablet Take 1 tablet (25 mg total) by mouth every 6 (six) hours as needed for nausea or vomiting. 06/09/14   Tilden FossaElizabeth Rees, MD  traZODone (DESYREL) 50 MG tablet Take 50 mg by mouth at bedtime.    Historical Provider, MD   BP 110/47 mmHg  Pulse 54  Temp(Src)  98.3 F (36.8 C) (Oral)  Resp 16  Ht 5\' 9"  (1.753 m)  Wt 150 lb (68.04 kg)  BMI 22.14 kg/m2  SpO2 99% Physical Exam  Constitutional: He appears well-developed.  Patient is hunched over his bed over the trash can with an emesis bag.  HENT:  Head: Atraumatic.  Neck: Neck supple.  Cardiovascular: Normal rate.   Pulmonary/Chest: Effort normal.  Abdominal: There is tenderness.  Mild diffuse abdominal tenderness without rebound or guarding.  Musculoskeletal: Normal range of motion.  Neurological: He is alert.  Skin: Skin is warm.    ED Course  Procedures (including critical care time) Labs  Review Labs Reviewed  COMPREHENSIVE METABOLIC PANEL - Abnormal; Notable for the following:    Chloride 97 (*)    Glucose, Bld 126 (*)    AST 59 (*)    ALT 116 (*)    All other components within normal limits  LIPASE, BLOOD - Abnormal; Notable for the following:    Lipase 20 (*)    All other components within normal limits  CBC WITH DIFFERENTIAL/PLATELET - Abnormal; Notable for the following:    WBC 13.7 (*)    RBC 6.02 (*)    Hemoglobin 18.7 (*)    MCHC 36.2 (*)    Neutro Abs 10.4 (*)    All other components within normal limits  URINALYSIS, ROUTINE W REFLEX MICROSCOPIC - Abnormal; Notable for the following:    APPearance CLOUDY (*)    Ketones, ur 40 (*)    All other components within normal limits  URINE RAPID DRUG SCREEN (HOSP PERFORMED) - Abnormal; Notable for the following:    Tetrahydrocannabinol POSITIVE (*)    All other components within normal limits  ETHANOL    Imaging Review No results found.   EKG Interpretation None      MDM   Final diagnoses:  Nausea vomiting and diarrhea  Abdominal pain, unspecified abdominal location  Dehydration     patient with nausea vomiting. Abdominal pain and some dehydration. CT done is reassuring.    Benjiman CoreNathan Prapti Grussing, MD 06/13/14 [phone]

## 2014-06-09 NOTE — Discharge Instructions (Signed)
Abdominal Pain °Many things can cause abdominal pain. Usually, abdominal pain is not caused by a disease and will improve without treatment. It can often be observed and treated at home. Your health care provider will do a physical exam and possibly order blood tests and X-rays to help determine the seriousness of your pain. However, in many cases, more time must pass before a clear cause of the pain can be found. Before that point, your health care provider may not know if you need more testing or further treatment. °HOME CARE INSTRUCTIONS  °Monitor your abdominal pain for any changes. The following actions may help to alleviate any discomfort you are experiencing: °· Only take over-the-counter or prescription medicines as directed by your health care provider. °· Do not take laxatives unless directed to do so by your health care provider. °· Try a clear liquid diet (broth, tea, or water) as directed by your health care provider. Slowly move to a bland diet as tolerated. °SEEK MEDICAL CARE IF: °· You have unexplained abdominal pain. °· You have abdominal pain associated with nausea or diarrhea. °· You have pain when you urinate or have a bowel movement. °· You experience abdominal pain that wakes you in the night. °· You have abdominal pain that is worsened or improved by eating food. °· You have abdominal pain that is worsened with eating fatty foods. °· You have a fever. °SEEK IMMEDIATE MEDICAL CARE IF:  °· Your pain does not go away within 2 hours. °· You keep throwing up (vomiting). °· Your pain is felt only in portions of the abdomen, such as the right side or the left lower portion of the abdomen. °· You pass bloody or black tarry stools. °MAKE SURE YOU: °· Understand these instructions.   °· Will watch your condition.   °· Will get help right away if you are not doing well or get worse.   °Document Released: 10/28/2004 Document Revised: 01/23/2013 Document Reviewed: 09/27/2012 °ExitCare® Patient Information  ©2015 [employer]. This information is not intended to replace advice given to you by your health care provider. Make sure you discuss any questions you have with your health care provider. ° °Nausea and Vomiting °Nausea means you feel sick to your stomach. Throwing up (vomiting) is a reflex where stomach contents come out of your mouth. °HOME CARE  °· Take medicine as told by your doctor. °· Do not force yourself to eat. However, you do need to drink fluids. °· If you feel like eating, eat a normal diet as told by your doctor. °¨ Eat rice, wheat, potatoes, bread, lean meats, yogurt, fruits, and vegetables. °¨ Avoid high-fat foods. °· Drink enough fluids to keep your pee (urine) clear or pale yellow. °· Ask your doctor how to replace body fluid losses (rehydrate). Signs of body fluid loss (dehydration) include: °¨ Feeling very thirsty. °¨ Dry lips and mouth. °¨ Feeling dizzy. °¨ Dark pee. °¨ Peeing less than normal. °¨ Feeling confused. °¨ Fast breathing or heart rate. °GET HELP RIGHT AWAY IF:  °· You have blood in your throw up. °· You have black or bloody poop (stool). °· You have a bad headache or stiff neck. °· You feel confused. °· You have bad belly (abdominal) pain. °· You have chest pain or trouble breathing. °· You do not pee at least once every 8 hours. °· You have cold, clammy skin. °· You keep throwing up after 24 to 48 hours. °· You have a fever. °MAKE SURE YOU:  °· Understand   these instructions. °· Will watch your condition. °· Will get help right away if you are not doing well or get worse. °Document Released: 07/07/2007 Document Revised: 04/12/2011 Document Reviewed: 06/19/2010 °ExitCare® Patient Information ©2015 [employer]. This information is not intended to replace advice given to you by your health care provider. Make sure you discuss any questions you have with your health care provider. ° °

## 2014-06-09 NOTE — ED Provider Notes (Signed)
Patient visit shared with Dr. Rubin PayorPickering.  Pt with substance abuse, here for abdominal pain, vomiting, diarrhea.  Patient hemoconcentrated and dehydrated on labs.  Patient is feeling improved on recheck without recurrent vomiting.  He is asking for one time dose of pain medications in ED for pain relief.  Abdominal exam with mild diffuse abdominal tenderness, no guarding or rebound tenderness.  Plan to treat pain in ED with close outpatient follow up and return precautions.  Presentation is not c/w cholecystitis, appendicitis, SBO, colitis.    Jeff FossaElizabeth Omran Keelin, MD 06/09/14 1600

## 2014-06-14 ENCOUNTER — Encounter (HOSPITAL_COMMUNITY): Payer: Self-pay

## 2014-06-14 ENCOUNTER — Observation Stay (HOSPITAL_COMMUNITY)
Admission: EM | Admit: 2014-06-14 | Discharge: 2014-06-18 | Disposition: A | Discharge status: Home / Self Care | Payer: Medicaid Other | Attending: Internal Medicine | Admitting: Internal Medicine

## 2014-06-14 ENCOUNTER — Emergency Department (HOSPITAL_COMMUNITY): Payer: Medicaid Other

## 2014-06-14 DIAGNOSIS — G8929 Other chronic pain: Secondary | ICD-10-CM | POA: Diagnosis present

## 2014-06-14 DIAGNOSIS — R1084 Generalized abdominal pain: Secondary | ICD-10-CM | POA: Diagnosis not present

## 2014-06-14 DIAGNOSIS — K2950 Unspecified chronic gastritis without bleeding: Secondary | ICD-10-CM | POA: Diagnosis not present

## 2014-06-14 DIAGNOSIS — R1110 Vomiting, unspecified: Secondary | ICD-10-CM | POA: Diagnosis not present

## 2014-06-14 DIAGNOSIS — K210 Gastro-esophageal reflux disease with esophagitis: Secondary | ICD-10-CM | POA: Diagnosis not present

## 2014-06-14 DIAGNOSIS — R109 Unspecified abdominal pain: Secondary | ICD-10-CM

## 2014-06-14 DIAGNOSIS — F17210 Nicotine dependence, cigarettes, uncomplicated: Secondary | ICD-10-CM | POA: Insufficient documentation

## 2014-06-14 DIAGNOSIS — K759 Inflammatory liver disease, unspecified: Secondary | ICD-10-CM | POA: Insufficient documentation

## 2014-06-14 DIAGNOSIS — F1120 Opioid dependence, uncomplicated: Secondary | ICD-10-CM | POA: Diagnosis present

## 2014-06-14 DIAGNOSIS — Z886 Allergy status to analgesic agent status: Secondary | ICD-10-CM | POA: Insufficient documentation

## 2014-06-14 DIAGNOSIS — F1020 Alcohol dependence, uncomplicated: Secondary | ICD-10-CM | POA: Diagnosis not present

## 2014-06-14 DIAGNOSIS — F1010 Alcohol abuse, uncomplicated: Secondary | ICD-10-CM | POA: Insufficient documentation

## 2014-06-14 DIAGNOSIS — F411 Generalized anxiety disorder: Secondary | ICD-10-CM | POA: Diagnosis not present

## 2014-06-14 DIAGNOSIS — B1920 Unspecified viral hepatitis C without hepatic coma: Secondary | ICD-10-CM | POA: Diagnosis not present

## 2014-06-14 DIAGNOSIS — F329 Major depressive disorder, single episode, unspecified: Secondary | ICD-10-CM | POA: Diagnosis not present

## 2014-06-14 DIAGNOSIS — K259 Gastric ulcer, unspecified as acute or chronic, without hemorrhage or perforation: Secondary | ICD-10-CM | POA: Diagnosis not present

## 2014-06-14 LAB — COMPREHENSIVE METABOLIC PANEL
ALT: 107 U/L — ABNORMAL HIGH (ref 17–63)
ANION GAP: 11 (ref 5–15)
AST: 53 U/L — AB (ref 15–41)
Albumin: 4.9 g/dL (ref 3.5–5.0)
Alkaline Phosphatase: 61 U/L (ref 38–126)
BILIRUBIN TOTAL: 1 mg/dL (ref 0.3–1.2)
BUN: 11 mg/dL (ref 6–20)
CHLORIDE: 101 mmol/L (ref 101–111)
CO2: 24 mmol/L (ref 22–32)
CREATININE: 0.8 mg/dL (ref 0.61–1.24)
Calcium: 9.6 mg/dL (ref 8.9–10.3)
GFR calc Af Amer: >60 mL/min (ref >60)
GFR calc non Af Amer: >60 mL/min (ref >60)
Glucose, Bld: 111 mg/dL — ABNORMAL HIGH (ref 65–99)
Potassium: 3.7 mmol/L (ref 3.5–5.1)
Sodium: 136 mmol/L (ref 135–145)
Total Protein: 8.1 g/dL (ref 6.5–8.1)

## 2014-06-14 LAB — CBC WITH DIFFERENTIAL/PLATELET
Basophils Absolute: 0 10*3/uL (ref 0.0–0.1)
Basophils Relative: 0 % (ref 0–1)
EOS ABS: 0 10*3/uL (ref 0.0–0.7)
Eosinophils Relative: 0 % (ref 0–5)
HCT: 50.2 % (ref 39.0–52.0)
Hemoglobin: 18 g/dL — ABNORMAL HIGH (ref 13.0–17.0)
LYMPHS ABS: 1.8 10*3/uL (ref 0.7–4.0)
Lymphocytes Relative: 15 % (ref 12–46)
MCH: 31 pg (ref 26.0–34.0)
MCHC: 35.9 g/dL (ref 30.0–36.0)
MCV: 86.4 fL (ref 78.0–100.0)
MONO ABS: 0.7 10*3/uL (ref 0.1–1.0)
Monocytes Relative: 6 % (ref 3–12)
NEUTROS ABS: 9.5 10*3/uL — AB (ref 1.7–7.7)
Neutrophils Relative %: 79 % — ABNORMAL HIGH (ref 43–77)
Platelets: 293 10*3/uL (ref 150–400)
RBC: 5.81 MIL/uL (ref 4.22–5.81)
RDW: 13.1 % (ref 11.5–15.5)
WBC: 12 10*3/uL — ABNORMAL HIGH (ref 4.0–10.5)

## 2014-06-14 LAB — LIPASE, BLOOD: Lipase: 19 U/L — ABNORMAL LOW (ref 22–51)

## 2014-06-14 LAB — ETHANOL

## 2014-06-14 MED ORDER — PROMETHAZINE HCL 25 MG PO TABS
12.5000 mg | ORAL_TABLET | Freq: Four times a day (QID) | ORAL | Status: DC | PRN
  Administered 2014-06-14 – 2014-06-15 (×2): 12.5 mg via ORAL
  Filled 2014-06-14: qty 1

## 2014-06-14 MED ORDER — LORAZEPAM 1 MG PO TABS: 1.0000 mg | ORAL_TABLET | Freq: Four times a day (QID) | ORAL | Status: AC | PRN

## 2014-06-14 MED ORDER — TRAZODONE HCL 50 MG PO TABS
50.0000 mg | ORAL_TABLET | Freq: Every day | ORAL | Status: DC
  Administered 2014-06-14 – 2014-06-17 (×4): 50 mg via ORAL
  Filled 2014-06-14 (×5): qty 1

## 2014-06-14 MED ORDER — LORAZEPAM 2 MG/ML IJ SOLN: 1.0000 mg | Freq: Four times a day (QID) | INTRAMUSCULAR | Status: AC | PRN

## 2014-06-14 MED ORDER — SODIUM CHLORIDE 0.9 % IV BOLUS (SEPSIS)
1000.0000 mL | Freq: Once | INTRAVENOUS | Status: AC
  Administered 2014-06-14: 1000 mL via INTRAVENOUS

## 2014-06-14 MED ORDER — ENOXAPARIN SODIUM 40 MG/0.4ML ~~LOC~~ SOLN
40.0000 mg | SUBCUTANEOUS | Status: DC
  Administered 2014-06-14 – 2014-06-17 (×4): 40 mg via SUBCUTANEOUS
  Filled 2014-06-14 (×5): qty 0.4

## 2014-06-14 MED ORDER — ONDANSETRON HCL 4 MG/2ML IJ SOLN
4.0000 mg | INTRAMUSCULAR | Status: DC | PRN
  Administered 2014-06-14: 4 mg via INTRAVENOUS
  Filled 2014-06-14: qty 2

## 2014-06-14 MED ORDER — FAMOTIDINE IN NACL 20-0.9 MG/50ML-% IV SOLN
20.0000 mg | Freq: Once | INTRAVENOUS | Status: AC
  Administered 2014-06-14: 20 mg via INTRAVENOUS
  Filled 2014-06-14: qty 50

## 2014-06-14 MED ORDER — THIAMINE HCL 100 MG/ML IJ SOLN
Freq: Once | INTRAVENOUS | Status: AC
  Administered 2014-06-14: 18:00:00 via INTRAVENOUS
  Filled 2014-06-14: qty 1000

## 2014-06-14 MED ORDER — ALUM & MAG HYDROXIDE-SIMETH 200-200-20 MG/5ML PO SUSP
30.0000 mL | Freq: Four times a day (QID) | ORAL | Status: DC | PRN
  Administered 2014-06-14: 30 mL via ORAL
  Filled 2014-06-14: qty 30

## 2014-06-14 MED ORDER — VITAMIN B-1 100 MG PO TABS
100.0000 mg | ORAL_TABLET | Freq: Every day | ORAL | Status: DC
  Administered 2014-06-16 – 2014-06-18 (×2): 100 mg via ORAL
  Filled 2014-06-14 (×4): qty 1

## 2014-06-14 MED ORDER — FOLIC ACID 1 MG PO TABS
1.0000 mg | ORAL_TABLET | Freq: Every day | ORAL | Status: DC
  Administered 2014-06-16 – 2014-06-18 (×2): 1 mg via ORAL
  Filled 2014-06-14 (×4): qty 1

## 2014-06-14 MED ORDER — HYDROMORPHONE HCL 1 MG/ML IJ SOLN
1.0000 mg | INTRAMUSCULAR | Status: DC | PRN
  Administered 2014-06-14 – 2014-06-18 (×17): 1 mg via INTRAVENOUS
  Filled 2014-06-14 (×17): qty 1

## 2014-06-14 MED ORDER — THIAMINE HCL 100 MG/ML IJ SOLN
100.0000 mg | Freq: Every day | INTRAMUSCULAR | Status: DC
  Administered 2014-06-15: 100 mg via INTRAVENOUS
  Filled 2014-06-14 (×4): qty 1

## 2014-06-14 MED ORDER — PROMETHAZINE HCL 25 MG/ML IJ SOLN
12.5000 mg | INTRAMUSCULAR | Status: AC | PRN
  Administered 2014-06-14 (×2): 12.5 mg via INTRAVENOUS
  Filled 2014-06-14 (×2): qty 1

## 2014-06-14 MED ORDER — ALPRAZOLAM 1 MG PO TABS
1.0000 mg | ORAL_TABLET | Freq: Every evening | ORAL | Status: DC | PRN
  Administered 2014-06-17: 1 mg via ORAL
  Filled 2014-06-14: qty 1

## 2014-06-14 MED ORDER — ADULT MULTIVITAMIN W/MINERALS CH
1.0000 | ORAL_TABLET | Freq: Every day | ORAL | Status: DC
  Administered 2014-06-16 – 2014-06-18 (×2): 1 via ORAL
  Filled 2014-06-14 (×4): qty 1

## 2014-06-14 MED ORDER — KETOROLAC TROMETHAMINE 30 MG/ML IJ SOLN
30.0000 mg | Freq: Once | INTRAMUSCULAR | Status: AC
  Administered 2014-06-14: 30 mg via INTRAVENOUS
  Filled 2014-06-14: qty 1

## 2014-06-14 NOTE — ED Notes (Addendum)
Per pt, abdominal pain with emesis x 10 days.  [facility] with [facility] dx.  Symptoms continue.  Pt unable to keep anything down.  No fever.  No diarrhea.  Pain is central abdomen.  No change in urination.  When questioned about previous phenergan/zofran scripts - did they help.  Pt states he only took them a couple of times b/c they made symptoms worse.

## 2014-06-14 NOTE — ED Notes (Signed)
Gave pt ice water per request

## 2014-06-14 NOTE — Plan of Care (Signed)
Problem: Phase I Progression Outcomes Goal: Initial discharge plan identified Outcome: Completed/Met Date Met:  06/14/14 Pt plans to go home at discharge.

## 2014-06-14 NOTE — H&P (Signed)
PCP:   No PCP Per Patient   Chief Complaint:  Abdominal pain, n/v  HPI: 28 yo male h/o chronic abdominal pain for ten years, hep c, opiate68 and etoh abuse in past, anxiety and depression comes in with over 2 days of persistent nausea and bilious vomiting with associated epigastric and ruq pain.  Pain is worse after eating and having a bowel movement.  Denies any fevers at home or diarrhea.  Does not take any chronic opiates.  No swelling anywhere or rashes.  Still has his gallbladder.  Says he was referred to a tertiary care center for evaluation of his chronic abdominal pain but has not done so yet.  He has had several iv medications for pain, nausea and he is still vomiting in the ED.  He is referred for admission for intractable n/v with pain.  He still actively drinking alcohol almost on a daily basis.  Review of Systems:  Positive and negative as per HPI otherwise all other systems are negative  Past Medical History: Past Medical History  Diagnosis Date  . Depression   . Chronic abdominal pain   . Anxiety   . Tourette disease   . Hepatitis C   . Alcohol dependence   . Opiate dependence    History reviewed. No pertinent past surgical history.  Medications: Prior to Admission medications   Medication Sig Start Date End Date Taking? Authorizing Provider  ondansetron (ZOFRAN) 4 MG tablet Take 1 tablet (4 mg total) by mouth every 6 (six) hours. 06/09/14  Yes Tilden FossaElizabeth Rees, MD  promethazine (PHENERGAN) 25 MG tablet Take 1 tablet (25 mg total) by mouth every 6 (six) hours as needed for nausea or vomiting. 06/09/14  Yes Tilden FossaElizabeth Rees, MD  ALPRAZolam Prudy Feeler(XANAX) 1 MG tablet Take 1 mg by mouth at bedtime as needed for anxiety.    Historical Provider, MD  busPIRone (BUSPAR) 10 MG tablet Take 1 tablet (10 mg total) by mouth 3 (three) times daily. Patient not taking: Reported on 06/14/2014 02/20/13   Beau FannyJohn C Withrow, FNP  desvenlafaxine (PRISTIQ) 50 MG 24 hr tablet Take 1 tablet (50 mg total) by  mouth daily. Patient not taking: Reported on 06/14/2014 02/20/13   Beau FannyJohn C Withrow, FNP  fluvoxaMINE (LUVOX) 25 MG tablet Take 1 tablet (25 mg total) by mouth at bedtime. Patient not taking: Reported on 06/14/2014 02/20/13   Beau FannyJohn C Withrow, FNP  traZODone (DESYREL) 50 MG tablet Take 50 mg by mouth at bedtime.    Historical Provider, MD    Allergies:   Allergies  Allergen Reactions  . Acetaminophen     Hep C    Social History:  reports that he has been smoking Cigarettes.  He has been smoking about 1.00 pack per day. He does not have any smokeless tobacco history on file. He reports that he drinks about 7.2 oz of alcohol per week. He reports that he uses illicit drugs (IV, Cocaine, and Marijuana).  Family History: Family History  Problem Relation Age of Onset  . Ulcerative colitis Father     Physical Exam: Filed Vitals:   06/14/14 0932 06/14/14 1405  BP: 119/88 117/66  Pulse: 59 50  Temp: 98.9 F (37.2 C)   TempSrc: Oral   Resp: 16 14  SpO2: 100% 100%   General appearance: alert and cooperative NAD Head: Normocephalic, without obvious abnormality, atraumatic Eyes: conjunctivae/corneas clear. PERRL, EOM's intact. Fundi benign. Nose: Nares normal. Septum midline. Mucosa normal. No drainage or sinus tenderness. Neck: no JVD and  supple, symmetrical, trachea midline Lungs: clear to auscultation bilaterally Heart: regular rate and rhythm, S1, S2 normal, no murmur, click, rub or gallop Abdomen: soft diffuse ttp nd, no r/g nonacute good bs Extremities: extremities normal, atraumatic, no cyanosis or edema Pulses: 2+ and symmetric Skin: Skin color, texture, turgor normal. No rashes or lesions Neurologic: Grossly normal    Labs on Admission:   Recent Labs  06/14/14 1006  NA 136  K 3.7  CL 101  CO2 24  GLUCOSE 111*  BUN 11  CREATININE 0.80  CALCIUM 9.6    Recent Labs  06/14/14 1006  AST 53*  ALT 107*  ALKPHOS 61  BILITOT 1.0  PROT 8.1  ALBUMIN 4.9    Recent  Labs  06/14/14 1006  LIPASE 19*    Recent Labs  06/14/14 1006  WBC 12.0*  NEUTROABS 9.5*  HGB 18.0*  HCT 50.2  MCV 86.4  PLT 293   Radiological Exams on Admission: Ct Abdomen Pelvis W Contrast  06/09/2014   CLINICAL DATA:  Nausea and vomiting. Symptoms for several days. Diffuse abdominal pain. Hepatitis C.  EXAM: CT ABDOMEN AND PELVIS WITH CONTRAST  TECHNIQUE: Multidetector CT imaging of the abdomen and pelvis was performed using the standard protocol following bolus administration of intravenous contrast.  CONTRAST:  50mL OMNIPAQUE IOHEXOL 300 MG/ML SOLN, OMNIPAQUE IOHEXOL 300 MG/ML SOLN  COMPARISON:  04/08/2012.  FINDINGS: Musculoskeletal:  Normal.  Lung Bases: Clear.  Liver: Tiny low-density lesion in the RIGHT hepatic lobe likely represents a tiny cysts.  Spleen:  Normal.  Gallbladder:  Normal.  Common bile duct:  Normal.  Pancreas:  Normal.  Adrenal glands:  Normal bilaterally.  Kidneys: Normal enhancement. LEFT ureter appears normal. RIGHT ureter appears normal.  Stomach:  Normal.  Small bowel: Normal. No small bowel obstruction. No mesenteric adenopathy.  Colon: Normal appendix. Most of the colon is collapsed with mural thickening likely secondary to underdistention. No obstruction or inflammatory changes.  Pelvic Genitourinary:  Normal urinary bladder.  Peritoneum: Tiny amount of free fluid in the anatomic pelvis which is abnormal but nonspecific.  Vascular/lymphatic: Normal.  Body Wall: Normal.  IMPRESSION: 1. Tiny amount of nonspecific intra-abdominal free fluid in the anatomic pelvis. 2. Most of the colon is collapsed which probably accounts for mild mural thickening. Colitis is considered less likely.   Electronically Signed   By: Andreas Newport M.D.   On: 06/09/2014 14:15   Dg Abd Acute W/chest  06/14/2014   CLINICAL DATA:  Cough. Nausea and vomiting. Mid abdominal pain beginning 2 days ago.  EXAM: DG ABDOMEN ACUTE W/ 1V CHEST  COMPARISON:  CT the abdomen and pelvis  06/09/2014.  FINDINGS: There is no evidence of dilated bowel loops or free intraperitoneal air. No radiopaque calculi or other significant radiographic abnormality is seen. Heart size and mediastinal contours are within normal limits. Both lungs are clear.  IMPRESSION: Negative abdominal radiographs.  No acute cardiopulmonary disease.   Electronically Signed   By: Marin Roberts M.D.   On: 06/14/2014 11:01   xrays reviewed by myself  Assessment/Plan  29 yo male with h/o etoh daily usage with acute on chronic abdominal pain with intractable nausea and vomiting  Principal Problem:   Intractable nausea and vomiting- with abd pain-  Will check for gallbladder disease and order HIDA scan.  Lfts/lipase are normal.  Reviewed old records and do not see much work up of his abdominal issues as of late or recent GI evaluation.  If symptoms do not  improve with iv dilaudid, iv phenergan, maalox and HIDA is normal, consider GI evaluation in am.  This could be related to polysubstance abuse. Had unrevealing ct scan several days ago, along with AAS today negative.  abd exam in nonacute.  Active Problems:   Opiate dependence-  Does not report chronic usage, uds pending   Alcohol dependence-  Place on CIWA with prn ativan ordered, monitor closely for withdrawal   Hepatitis C-  stable   Generalized anxiety disorder   Chronic abdominal pain   Abdominal pain, acute, generalized-  As above  obs on medical bed.   FULL CODE.  Kylea Berrong A 06/14/2014, 3:50 PM

## 2014-06-14 NOTE — ED Notes (Signed)
RN requested that pt provide urine, reports he just tried and is unable to produce urine.

## 2014-06-14 NOTE — ED Provider Notes (Signed)
CSN: [id number]     Arrival date & time 06/14/14  [id number] History   First MD Initiated Contact with Patient 06/14/14 [phone]     Chief Complaint  Patient presents with  . Abdominal Pain  . Emesis      HPI  Pt was seen at 0850.  Per pt, c/o gradual onset and persistence of constant upper abd "pain" for the past 2 weeks.  Has been associated with multiple intermittent episodes of N/V.  Describes the abd pain as "cramping." Pt has been evaluated in the ED 5 days ago for same, with reassuring CT scan and labs. Pt states he was rx zofran and phenergan, but "didn't take them" because it "made it worse." Denies diarrhea, no fevers, no back pain, no rash, no CP/SOB, no black or blood in stools or emesis.      Past Medical History  Diagnosis Date  . Depression   . Chronic abdominal pain   . Anxiety   . Tourette disease   . Hepatitis C   . Alcohol dependence   . Opiate dependence    History reviewed. No pertinent past surgical history. Family History  Problem Relation Age of Onset  . Ulcerative colitis Father    History  Substance Use Topics  . Smoking status: Current Every Day Smoker -- 1.00 packs/day    Types: Cigarettes  . Smokeless tobacco: Not on file  . Alcohol Use: 7.2 oz/week    12 Cans of beer per week     Comment: Daily     Review of Systems ROS: Statement: All systems negative except as marked or noted in the HPI; Constitutional: Negative for fever and chills. ; ; Eyes: Negative for eye pain, redness and discharge. ; ; ENMT: Negative for ear pain, hoarseness, nasal congestion, sinus pressure and sore throat. ; ; Cardiovascular: Negative for chest pain, palpitations, diaphoresis, dyspnea and peripheral edema. ; ; Respiratory: Negative for cough, wheezing and stridor. ; ; Gastrointestinal: +N/V, abd pain. Negative for diarrhea, blood in stool, hematemesis, jaundice and rectal bleeding. . ; ; Genitourinary: Negative for dysuria, flank pain and hematuria. ; ; Musculoskeletal: Negative  for back pain and neck pain. Negative for swelling and trauma.; ; Skin: Negative for pruritus, rash, abrasions, blisters, bruising and skin lesion.; ; Neuro: Negative for headache, lightheadedness and neck stiffness. Negative for weakness, altered level of consciousness , altered mental status, extremity weakness, paresthesias, involuntary movement, seizure and syncope.      Allergies  Acetaminophen  Home Medications   Prior to Admission medications   Medication Sig Start Date End Date Taking? Authorizing Provider  ondansetron (ZOFRAN) 4 MG tablet Take 1 tablet (4 mg total) by mouth every 6 (six) hours. 06/09/14  Yes Tilden FossaElizabeth Rees, MD  promethazine (PHENERGAN) 25 MG tablet Take 1 tablet (25 mg total) by mouth every 6 (six) hours as needed for nausea or vomiting. 06/09/14  Yes Tilden FossaElizabeth Rees, MD  ALPRAZolam Prudy Feeler(XANAX) 1 MG tablet Take 1 mg by mouth at bedtime as needed for anxiety.    Historical Provider, MD  busPIRone (BUSPAR) 10 MG tablet Take 1 tablet (10 mg total) by mouth 3 (three) times daily. Patient not taking: Reported on 06/14/2014 02/20/13   Beau FannyJohn C Withrow, FNP  desvenlafaxine (PRISTIQ) 50 MG 24 hr tablet Take 1 tablet (50 mg total) by mouth daily. Patient not taking: Reported on 06/14/2014 02/20/13   Beau FannyJohn C Withrow, FNP  fluvoxaMINE (LUVOX) 25 MG tablet Take 1 tablet (25 mg total) by mouth at bedtime.  Patient not taking: Reported on 06/14/2014 02/20/13   Beau FannyJohn C Withrow, FNP  traZODone (DESYREL) 50 MG tablet Take 50 mg by mouth at bedtime.    Historical Provider, MD   BP 119/88 mmHg  Pulse 59  Temp(Src) 98.9 F (37.2 C) (Oral)  Resp 16  SpO2 100% Physical Exam 0955: Physical examination:  Nursing notes reviewed; Vital signs and O2 SAT reviewed;  Constitutional: Well developed, Well nourished, Well hydrated, Uncomfortable appearing.; Head:  Normocephalic, atraumatic; Eyes: EOMI, PERRL, No scleral icterus; ENMT: Mouth and pharynx normal, Mucous membranes moist; Neck: Supple, Full range of  motion, No lymphadenopathy; Cardiovascular: Regular rate and rhythm, No murmur, rub, or gallop; Respiratory: Breath sounds clear & equal bilaterally, No rales, rhonchi, wheezes.  Speaking full sentences with ease, Normal respiratory effort/excursion; Chest: Nontender, Movement normal; Abdomen: Soft, +mid-epigastric and LUQ tenderness to palp. No rebound or guarding. Nondistended, Normal bowel sounds; Genitourinary: No CVA tenderness; Extremities: Pulses normal, No tenderness, No edema, No calf edema or asymmetry.; Neuro: AA&Ox3, Major CN grossly intact.  Speech clear. No gross focal motor or sensory deficits in extremities.; Skin: Color normal, Warm, Dry.   ED Course  Procedures     EKG Interpretation None      MDM  MDM Reviewed: previous chart, nursing note and vitals Reviewed previous: labs and CT scan Interpretation: labs and x-ray     Results for orders placed or performed during the hospital encounter of 06/14/14  CBC with Differential  Result Value Ref Range   WBC 12.0 (H) 4.0 - 10.5 K/uL   RBC 5.81 4.22 - 5.81 MIL/uL   Hemoglobin 18.0 (H) 13.0 - 17.0 g/dL   HCT 81.150.2 91.439.0 - 78.252.0 %   MCV 86.4 78.0 - 100.0 fL   MCH 31.0 26.0 - 34.0 pg   MCHC 35.9 30.0 - 36.0 g/dL   RDW 95.613.1 21.311.5 - 08.615.5 %   Platelets 293 150 - 400 K/uL   Neutrophils Relative % 79 (H) 43 - 77 %   Neutro Abs 9.5 (H) 1.7 - 7.7 K/uL   Lymphocytes Relative 15 12 - 46 %   Lymphs Abs 1.8 0.7 - 4.0 K/uL   Monocytes Relative 6 3 - 12 %   Monocytes Absolute 0.7 0.1 - 1.0 K/uL   Eosinophils Relative 0 0 - 5 %   Eosinophils Absolute 0.0 0.0 - 0.7 K/uL   Basophils Relative 0 0 - 1 %   Basophils Absolute 0.0 0.0 - 0.1 K/uL  Comprehensive metabolic panel  Result Value Ref Range   Sodium 136 135 - 145 mmol/L   Potassium 3.7 3.5 - 5.1 mmol/L   Chloride 101 101 - 111 mmol/L   CO2 24 22 - 32 mmol/L   Glucose, Bld 111 (H) 65 - 99 mg/dL   BUN 11 6 - 20 mg/dL   Creatinine, Ser 5.780.80 0.61 - 1.24 mg/dL   Calcium 9.6 8.9  - 46.910.3 mg/dL   Total Protein 8.1 6.5 - 8.1 g/dL   Albumin 4.9 3.5 - 5.0 g/dL   AST 53 (H) 15 - 41 U/L   ALT 107 (H) 17 - 63 U/L   Alkaline Phosphatase 61 38 - 126 U/L   Total Bilirubin 1.0 0.3 - 1.2 mg/dL   GFR calc non Af Amer >60 >60 mL/min   GFR calc Af Amer >60 >60 mL/min   Anion gap 11 5 - 15  Lipase, blood  Result Value Ref Range   Lipase 19 (L) 22 - 51 U/L  Ethanol  Result Value Ref Range   Alcohol, Ethyl (B) <5 <5 mg/dL   Ct Abdomen Pelvis W Contrast 06/09/2014   CLINICAL DATA:  Nausea and vomiting. Symptoms for several days. Diffuse abdominal pain. Hepatitis C.  EXAM: CT ABDOMEN AND PELVIS WITH CONTRAST  TECHNIQUE: Multidetector CT imaging of the abdomen and pelvis was performed using the standard protocol following bolus administration of intravenous contrast.  CONTRAST:  50mL OMNIPAQUE IOHEXOL 300 MG/ML SOLN, OMNIPAQUE IOHEXOL 300 MG/ML SOLN  COMPARISON:  04/08/2012.  FINDINGS: Musculoskeletal:  Normal.  Lung Bases: Clear.  Liver: Tiny low-density lesion in the RIGHT hepatic lobe likely represents a tiny cysts.  Spleen:  Normal.  Gallbladder:  Normal.  Common bile duct:  Normal.  Pancreas:  Normal.  Adrenal glands:  Normal bilaterally.  Kidneys: Normal enhancement. LEFT ureter appears normal. RIGHT ureter appears normal.  Stomach:  Normal.  Small bowel: Normal. No small bowel obstruction. No mesenteric adenopathy.  Colon: Normal appendix. Most of the colon is collapsed with mural thickening likely secondary to underdistention. No obstruction or inflammatory changes.  Pelvic Genitourinary:  Normal urinary bladder.  Peritoneum: Tiny amount of free fluid in the anatomic pelvis which is abnormal but nonspecific.  Vascular/lymphatic: Normal.  Body Wall: Normal.  IMPRESSION: 1. Tiny amount of nonspecific intra-abdominal free fluid in the anatomic pelvis. 2. Most of the colon is collapsed which probably accounts for mild mural thickening. Colitis is considered less likely.   Electronically  Signed   By: Andreas Newport M.D.   On: 06/09/2014 14:15   Dg Abd Acute W/chest 06/14/2014   CLINICAL DATA:  Cough. Nausea and vomiting. Mid abdominal pain beginning 2 days ago.  EXAM: DG ABDOMEN ACUTE W/ 1V CHEST  COMPARISON:  CT the abdomen and pelvis 06/09/2014.  FINDINGS: There is no evidence of dilated bowel loops or free intraperitoneal air. No radiopaque calculi or other significant radiographic abnormality is seen. Heart size and mediastinal contours are within normal limits. Both lungs are clear.  IMPRESSION: Negative abdominal radiographs.  No acute cardiopulmonary disease.   Electronically Signed   By: Marin Roberts M.D.   On: 06/14/2014 11:01    1440:  Pt continues to have N/V despite multiple doses of IV medication for same. LFT's elevated, but c/w previous. Will re-medicate, observation admit. T/C to [facility]. Onalee Hua, case discussed, including:  HPI, pertinent PM/SHx, VS/PE, dx testing, ED course and treatment:  Agreeable to come to ED for evaluation.    Samuel Jester, DO 06/16/14 [phone]

## 2014-06-15 ENCOUNTER — Observation Stay (HOSPITAL_COMMUNITY): Payer: Medicaid Other

## 2014-06-15 DIAGNOSIS — G43A1 Cyclical vomiting, intractable: Secondary | ICD-10-CM | POA: Diagnosis not present

## 2014-06-15 DIAGNOSIS — R1084 Generalized abdominal pain: Secondary | ICD-10-CM | POA: Diagnosis not present

## 2014-06-15 DIAGNOSIS — F1020 Alcohol dependence, uncomplicated: Secondary | ICD-10-CM | POA: Diagnosis not present

## 2014-06-15 DIAGNOSIS — B182 Chronic viral hepatitis C: Secondary | ICD-10-CM | POA: Diagnosis not present

## 2014-06-15 LAB — COMPREHENSIVE METABOLIC PANEL
ALT: 82 U/L — ABNORMAL HIGH (ref 17–63)
AST: 44 U/L — ABNORMAL HIGH (ref 15–41)
Albumin: 3.9 g/dL (ref 3.5–5.0)
Alkaline Phosphatase: 48 U/L (ref 38–126)
Anion gap: 8 (ref 5–15)
BUN: 10 mg/dL (ref 6–20)
CO2: 25 mmol/L (ref 22–32)
Calcium: 8.6 mg/dL — ABNORMAL LOW (ref 8.9–10.3)
Chloride: 103 mmol/L (ref 101–111)
Creatinine, Ser: 0.8 mg/dL (ref 0.61–1.24)
GFR calc non Af Amer: >60 mL/min (ref >60)
Glucose, Bld: 89 mg/dL (ref 65–99)
POTASSIUM: 4.1 mmol/L (ref 3.5–5.1)
Sodium: 136 mmol/L (ref 135–145)
Total Bilirubin: 0.8 mg/dL (ref 0.3–1.2)
Total Protein: 6.6 g/dL (ref 6.5–8.1)

## 2014-06-15 LAB — RAPID URINE DRUG SCREEN, HOSP PERFORMED
AMPHETAMINES: NOT DETECTED
Barbiturates: NOT DETECTED
Benzodiazepines: NOT DETECTED
Cocaine: NOT DETECTED
Opiates: POSITIVE — AB
Tetrahydrocannabinol: POSITIVE — AB

## 2014-06-15 LAB — LIPASE, BLOOD: Lipase: 22 U/L (ref 22–51)

## 2014-06-15 LAB — URINALYSIS, ROUTINE W REFLEX MICROSCOPIC
Bilirubin Urine: NEGATIVE
Glucose, UA: NEGATIVE mg/dL
Hgb urine dipstick: NEGATIVE
Ketones, ur: NEGATIVE mg/dL
Leukocytes, UA: NEGATIVE
Nitrite: NEGATIVE
Protein, ur: NEGATIVE mg/dL
Specific Gravity, Urine: 1.01 (ref 1.005–1.030)
Urobilinogen, UA: 0.2 mg/dL (ref 0.0–1.0)
pH: 7 (ref 5.0–8.0)

## 2014-06-15 LAB — CBC
HCT: 43.9 % (ref 39.0–52.0)
HEMOGLOBIN: 15.1 g/dL (ref 13.0–17.0)
MCH: 30.4 pg (ref 26.0–34.0)
MCHC: 34.4 g/dL (ref 30.0–36.0)
MCV: 88.5 fL (ref 78.0–100.0)
PLATELETS: 257 10*3/uL (ref 150–400)
RBC: 4.96 MIL/uL (ref 4.22–5.81)
RDW: 13.1 % (ref 11.5–15.5)
WBC: 10.2 10*3/uL (ref 4.0–10.5)

## 2014-06-15 MED ORDER — ALUM & MAG HYDROXIDE-SIMETH 200-200-20 MG/5ML PO SUSP
30.0000 mL | Freq: Three times a day (TID) | ORAL | Status: DC
  Administered 2014-06-15 – 2014-06-18 (×8): 30 mL via ORAL
  Filled 2014-06-15 (×8): qty 30

## 2014-06-15 MED ORDER — PANTOPRAZOLE SODIUM 40 MG IV SOLR
40.0000 mg | Freq: Two times a day (BID) | INTRAVENOUS | Status: DC
  Administered 2014-06-15 – 2014-06-18 (×7): 40 mg via INTRAVENOUS
  Filled 2014-06-15 (×8): qty 40

## 2014-06-15 MED ORDER — HYDROMORPHONE HCL 1 MG/ML IJ SOLN
0.5000 mg | Freq: Once | INTRAMUSCULAR | Status: AC
  Administered 2014-06-15: 0.5 mg via INTRAVENOUS
  Filled 2014-06-15: qty 1

## 2014-06-15 MED ORDER — CHLORDIAZEPOXIDE HCL 5 MG PO CAPS
10.0000 mg | ORAL_CAPSULE | Freq: Three times a day (TID) | ORAL | Status: DC
  Administered 2014-06-15 – 2014-06-16 (×5): 10 mg via ORAL
  Administered 2014-06-17: 5 mg via ORAL
  Filled 2014-06-15 (×5): qty 2

## 2014-06-15 MED ORDER — TECHNETIUM TC 99M MEBROFENIN IV KIT
5.0000 | PACK | Freq: Once | INTRAVENOUS | Status: AC | PRN
Start: 2014-06-15 — End: 2014-06-15
  Administered 2014-06-15: 5 via INTRAVENOUS

## 2014-06-15 NOTE — Progress Notes (Signed)
Patient Demographics  Jeff Browning, is a 29 y.o. male, DOB - [birth year], NFA:[id number]  Admit date - 06/14/2014   Admitting Physician Haydee Monica, MD  Outpatient Primary MD for the patient is No PCP Per Patient  LOS -    Chief Complaint  Patient presents with  . Abdominal Pain  . Emesis        Subjective:   [name] today has, No headache, No chest pain, minimal epigastric abdominal pain - No Nausea, No new weakness tingling or numbness, No Cough - SOB.    Assessment & Plan    1. Recurrent abdominal pain which appears to be epigastric with some nausea vomiting. Currently resolved, history of heavy alcohol abuse could have cause gastritis and symptoms, lipase is stable, placed on PPI, HIDA scan has been ordered upon admission which is pending. Family history positive for colitis. If stable will be discharged home on PPI, counseled to quit alcohol, outpatient GI follow-up. If symptoms recur we will consider CT abdomen pelvis.   2. Alcohol abuse. Counseled to quit, continue CIWA protocol. Will place on Librium. Request social work to provide him outpatient resources.   3. Anxiety and depression. Continue Xanax along with other home medications unchanged .     Code Status: Full  Family Communication: none  Disposition Plan: Home 1-2 days   Consults  None   Procedures   HIDA pending   DVT Prophylaxis  Lovenox    Lab Results  Component Value Date   PLT 257 06/15/2014    Medications  Scheduled Meds: . enoxaparin (LOVENOX) injection  40 mg Subcutaneous Q24H  . folic acid  1 mg Oral Daily  . multivitamin with minerals  1 tablet Oral Daily  . pantoprazole (PROTONIX) IV  40 mg Intravenous Q12H  . thiamine  100 mg Oral Daily   Or  . thiamine  100 mg Intravenous  Daily  . traZODone  50 mg Oral QHS   Continuous Infusions:  PRN Meds:.ALPRAZolam, alum & mag hydroxide-simeth, HYDROmorphone (DILAUDID) injection, LORazepam **OR** LORazepam, promethazine  Antibiotics     Anti-infectives    None        Objective:   Filed Vitals:   06/14/14 1729 06/14/14 1749 06/14/14 2115 06/15/14 0530  BP:  127/63 102/59 136/84  Pulse:  46 47 112  Temp:  98.7 F (37.1 C) 98.9 F (37.2 C) 98.8 F (37.1 C)  TempSrc:  Oral Oral Oral  Resp:  Height:  (1.753 m)     Weight: 68.04 kg (150 lb)     SpO2:  100% 100% 99%    Wt Readings from Last 3 Encounters:  06/14/14 68.04 kg (150 lb)  06/09/14 68.04 kg (150 lb)  02/16/13 74.844 kg (165 lb)     Intake/Output Summary (Last 24 hours) at 06/15/14 0948 Last data filed at 06/15/14 0600  Gross per 24 hour  Intake    480 ml  Output    600 ml  Net   -120 ml     Physical Exam  Awake Alert, Oriented X 3, No new F.N deficits, Normal affect [place].AT,PERRAL Supple Neck,No JVD, No cervical lymphadenopathy appriciated.  Symmetrical Chest wall movement, Good air movement bilaterally, CTAB RRR,No Gallops,Rubs  or new Murmurs, No Parasternal Heave +ve B.Sounds, Abd Soft, No tenderness, No organomegaly appriciated, No rebound - guarding or rigidity. No Cyanosis, Clubbing or edema, No new Rash or bruise      Data Review   Micro Results No results found for this or any previous visit (from the past 240 hour(s)).  Radiology Reports Ct Abdomen Pelvis W Contrast  06/09/2014   CLINICAL DATA:  Nausea and vomiting. Symptoms for several days. Diffuse abdominal pain. Hepatitis C.  EXAM: CT ABDOMEN AND PELVIS WITH CONTRAST  TECHNIQUE: Multidetector CT imaging of the abdomen and pelvis was performed using the standard protocol following bolus administration of intravenous contrast.  CONTRAST:  50mL OMNIPAQUE IOHEXOL 300 MG/ML SOLN, 100mL OMNIPAQUE IOHEXOL 300 MG/ML SOLN  COMPARISON:  04/08/2012.  FINDINGS:  Musculoskeletal:  Normal.  Lung Bases: Clear.  Liver: Tiny low-density lesion in the RIGHT hepatic lobe likely represents a tiny cysts.  Spleen:  Normal.  Gallbladder:  Normal.  Common bile duct:  Normal.  Pancreas:  Normal.  Adrenal glands:  Normal bilaterally.  Kidneys: Normal enhancement. LEFT ureter appears normal. RIGHT ureter appears normal.  Stomach:  Normal.  Small bowel: Normal. No small bowel obstruction. No mesenteric adenopathy.  Colon: Normal appendix. Most of the colon is collapsed with mural thickening likely secondary to underdistention. No obstruction or inflammatory changes.  Pelvic Genitourinary:  Normal urinary bladder.  Peritoneum: Tiny amount of free fluid in the anatomic pelvis which is abnormal but nonspecific.  Vascular/lymphatic: Normal.  Body Wall: Normal.  IMPRESSION: 1. Tiny amount of nonspecific intra-abdominal free fluid in the anatomic pelvis. 2. Most of the colon is collapsed which probably accounts for mild mural thickening. Colitis is considered less likely.   Electronically Signed   By: Andreas NewportGeoffrey  Lamke M.D.   On: 06/09/2014 14:15   Dg Abd Acute W/chest  06/14/2014   CLINICAL DATA:  Cough. Nausea and vomiting. Mid abdominal pain beginning 2 days ago.  EXAM: DG ABDOMEN ACUTE W/ 1V CHEST  COMPARISON:  CT the abdomen and pelvis 06/09/2014.  FINDINGS: There is no evidence of dilated bowel loops or free intraperitoneal air. No radiopaque calculi or other significant radiographic abnormality is seen. Heart size and mediastinal contours are within normal limits. Both lungs are clear.  IMPRESSION: Negative abdominal radiographs.  No acute cardiopulmonary disease.   Electronically Signed   By: Marin Robertshristopher  Mattern M.D.   On: 06/14/2014 11:01     CBC  Recent Labs Lab 06/09/14 0925 06/14/14 1006 06/15/14 0548  WBC 13.7* 12.0* 10.2  HGB 18.7* 18.0* 15.1  HCT 51.6 50.2 43.9  PLT 294 293 257  MCV 85.7 86.4 88.5  MCH 31.1 31.0 30.4  MCHC 36.2* 35.9 34.4  RDW 13.9 13.1 13.1    LYMPHSABS 2.2 1.8  --   MONOABS 1.0 0.7  --   EOSABS 0.1 0.0  --   BASOSABS 0.1 0.0  --     Chemistries   Recent Labs Lab 06/09/14 0925 06/14/14 1006 06/15/14 0548  NA 137 136 136  K 3.5 3.7 4.1  CL 97* 101 103  CO2 27 24 25   GLUCOSE 126* 111* 89  BUN 16 11 10   CREATININE 0.87 0.80 0.80  CALCIUM 9.7 9.6 8.6*  AST 59* 53* 44*  ALT 116* 107* 82*  ALKPHOS 72 61 48  BILITOT 1.1 1.0 0.8   ------------------------------------------------------------------------------------------------------------------ estimated creatinine clearance is 134.6 mL/min (by C-G formula based on Cr of 0.8). ------------------------------------------------------------------------------------------------------------------ No results for input(s): HGBA1C in the last 72  hours. ------------------------------------------------------------------------------------------------------------------ No results for input(s): CHOL, HDL, LDLCALC, TRIG, CHOLHDL, LDLDIRECT in the last 72 hours. ------------------------------------------------------------------------------------------------------------------ No results for input(s): TSH, T4TOTAL, T3FREE, THYROIDAB in the last 72 hours.  Invalid input(s): FREET3 ------------------------------------------------------------------------------------------------------------------ No results for input(s): VITAMINB12, FOLATE, FERRITIN, TIBC, IRON, RETICCTPCT in the last 72 hours.  Coagulation profile No results for input(s): INR, PROTIME in the last 168 hours.  No results for input(s): DDIMER in the last 72 hours.  Cardiac Enzymes No results for input(s): CKMB, TROPONINI, MYOGLOBIN in the last 168 hours.  Invalid input(s): CK ------------------------------------------------------------------------------------------------------------------ Invalid input(s): POCBNP   Time Spent in minutes   35   Susa RaringSINGH,Akire Rennert K M.D on 06/15/2014 at 9:48 AM  Between 7am to 7pm -  Pager - [phone]  After 7pm go to [email or web address] - password [email or web address]  [facility]   Office  [phone]

## 2014-06-15 NOTE — Progress Notes (Signed)
Pt complaining dilaudid did not help his pain very much. MD notified. Order for additional dose of dilaudid 0.5mg  given. Will continue to monitor pt Jeff BertholdLemons, Batina Dougan King

## 2014-06-16 DIAGNOSIS — B182 Chronic viral hepatitis C: Secondary | ICD-10-CM

## 2014-06-16 DIAGNOSIS — R1084 Generalized abdominal pain: Secondary | ICD-10-CM | POA: Diagnosis not present

## 2014-06-16 DIAGNOSIS — F1020 Alcohol dependence, uncomplicated: Secondary | ICD-10-CM | POA: Diagnosis not present

## 2014-06-16 MED ORDER — SUCRALFATE 1 GM/10ML PO SUSP
1.0000 g | Freq: Three times a day (TID) | ORAL | Status: DC
  Administered 2014-06-16 – 2014-06-18 (×5): 1 g via ORAL
  Filled 2014-06-16 (×11): qty 10

## 2014-06-16 MED ORDER — HYDROCODONE-ACETAMINOPHEN 7.5-325 MG PO TABS
1.0000 | ORAL_TABLET | Freq: Four times a day (QID) | ORAL | Status: AC | PRN
  Administered 2014-06-16 – 2014-06-17 (×5): 1 via ORAL
  Filled 2014-06-16 (×5): qty 1

## 2014-06-16 MED ORDER — HYDROCODONE-ACETAMINOPHEN 7.5-325 MG PO TABS: 1.0000 | ORAL_TABLET | Freq: Four times a day (QID) | ORAL | Status: DC | PRN

## 2014-06-16 NOTE — Consult Note (Signed)
[facility]  Referring Provider: Dr. Susa RaringPrashant Singh [facility] Primary Care Physician:  No PCP Per Patient Primary Gastroenterologist:  None  Reason for Consultation:  Abdominal pain  HPI: Jeff Browning is a 29 y.o. male whom we've been asked to see for evaluation of abdominal pain.  Patient tells me that he has had intermittent troubles with epigastric pain for the past 5-7 years.  Pain is a stabbing and pressure-like sensation, can wake up at night from pain.  Pain can worsen after eating, but not reliably so.  Pain can be associated with nausea and vomiting and can radiate to the back. Pain typically lasts a day or two, but most recent pain has been constant for the past couple weeks.  He has history of drinking about 12-pack per week chronically, but has consumed a lot more recently due to stress related to his brother's recent drug overdose.  He has occasional constipation, but pain doesn't seem related to this.  No blood in stool.  He hasn't eaten much over the past several days due to the pain and nausea.  Has lost some weight recently due to not eating.  History of IV drug use with hepatitis C, not treated, has been drug-free for the past 2 years.  Has had CT scan couple weeks ago, unrevealing, ultrasound a few years ago showed normal-appearing gallbladder, HIDA scan recently showed possible delayed gallbladder emptying.  No acetaminophen; rare NSAIDs.   Past Medical History  Diagnosis Date  . Depression   . Chronic abdominal pain   . Anxiety   . Tourette disease   . Hepatitis C   . Alcohol dependence   . Opiate dependence     History reviewed. No pertinent past surgical history.  Prior to Admission medications   Medication Sig Start Date End Date Taking? Authorizing Provider  ondansetron (ZOFRAN) 4 MG tablet Take 1 tablet (4 mg total) by mouth every 6 (six) hours. 06/09/14  Yes Tilden FossaElizabeth Rees, MD  promethazine (PHENERGAN) 25 MG tablet Take 1 tablet (25 mg  total) by mouth every 6 (six) hours as needed for nausea or vomiting. 06/09/14  Yes Tilden FossaElizabeth Rees, MD  ALPRAZolam Prudy Feeler(XANAX) 1 MG tablet Take 1 mg by mouth at bedtime as needed for anxiety.    Historical Provider, MD  busPIRone (BUSPAR) 10 MG tablet Take 1 tablet (10 mg total) by mouth 3 (three) times daily. Patient not taking: Reported on 06/14/2014 02/20/13   Beau FannyJohn C Withrow, FNP  desvenlafaxine (PRISTIQ) 50 MG 24 hr tablet Take 1 tablet (50 mg total) by mouth daily. Patient not taking: Reported on 06/14/2014 02/20/13   Beau FannyJohn C Withrow, FNP  fluvoxaMINE (LUVOX) 25 MG tablet Take 1 tablet (25 mg total) by mouth at bedtime. Patient not taking: Reported on 06/14/2014 02/20/13   Beau FannyJohn C Withrow, FNP  traZODone (DESYREL) 50 MG tablet Take 50 mg by mouth at bedtime.    Historical Provider, MD    Current Facility-Administered Medications  Medication Dose Route Frequency Provider Last Rate Last Dose  . ALPRAZolam (XANAX) tablet 1 mg  1 mg Oral QHS PRN Haydee Monicaachal A David, MD      . alum & mag hydroxide-simeth (MAALOX/MYLANTA) 200-200-20 MG/5ML suspension 30 mL  30 mL Oral TID Leroy SeaPrashant K Singh, MD   30 mL at 06/16/14 0938  . chlordiazePOXIDE (LIBRIUM) capsule 10 mg  10 mg Oral TID Leroy SeaPrashant K Singh, MD   10 mg at 06/16/14 [id number]  . enoxaparin (LOVENOX) injection 40 mg  40  mg Subcutaneous Q24H Haydee Monicaachal A David, MD   40 mg at 06/15/14 2144  . folic acid (FOLVITE) tablet 1 mg  1 mg Oral Daily Haydee Monicaachal A David, MD   1 mg at 06/16/14 [id number]  . HYDROmorphone (DILAUDID) injection 1 mg  1 mg Intravenous Q4H PRN Haydee Monicaachal A David, MD   1 mg at 06/16/14 [id number]  . LORazepam (ATIVAN) tablet 1 mg  1 mg Oral Q6H PRN Haydee Monicaachal A David, MD       Or  . LORazepam (ATIVAN) injection 1 mg  1 mg Intravenous Q6H PRN Haydee Monicaachal A David, MD      . multivitamin with minerals tablet 1 tablet  1 tablet Oral Daily Haydee Monicaachal A David, MD   1 tablet at 06/16/14 [phone]  . pantoprazole (PROTONIX) injection 40 mg  40 mg Intravenous Q12H Leroy SeaPrashant K Singh, MD   40 mg at 06/16/14  [id number]  . promethazine (PHENERGAN) tablet 12.5 mg  12.5 mg Oral Q6H PRN Haydee Monicaachal A David, MD   12.5 mg at 06/15/14 0533  . thiamine (VITAMIN B-1) tablet 100 mg  100 mg Oral Daily Haydee Monicaachal A David, MD   100 mg at 06/16/14 [id number]   Or  . thiamine (B-1) injection 100 mg  100 mg Intravenous Daily Haydee Monicaachal A David, MD   100 mg at 06/15/14 1100  . traZODone (DESYREL) tablet 50 mg  50 mg Oral QHS Haydee Monicaachal A David, MD   50 mg at 06/15/14 2144    Allergies as of 06/14/2014 - Review Complete 06/14/2014  Allergen Reaction Noted  . Acetaminophen  09/19/2012    Family History  Problem Relation Age of Onset  . Ulcerative colitis Father     History   Social History  . Marital Status: Single    Spouse Name: N/A  . Number of Children: N/A  . Years of Education: N/A   Occupational History  . Not on file.   Social History Main Topics  . Smoking status: Current Every Day Smoker -- 1.00 packs/day    Types: Cigarettes  . Smokeless tobacco: Not on file  . Alcohol Use: 7.2 oz/week    12 Cans of beer per week     Comment: Daily   . Drug Use: Yes    Special: IV, Cocaine, Marijuana     Comment: denies current drug use other than occasional marijuana  . Sexual Activity: Not on file   Other Topics Concern  . Not on file   Social History Narrative    Review of Systems: as per HPI, all others negative.  Physical Exam: Vital signs in last 24 hours: Temp:  [98.2 F (36.8 C)-99 F (37.2 C)] 98.2 F (36.8 C) (05/15 0525) Pulse Rate:  [48-63] 52 (05/15 0525) Resp:  [18] 18 (05/15 0525) BP: (98-119)/(54-77) 98/54 mmHg (05/15 0525) SpO2:  [100 %] 100 % (05/15 0525) Last BM Date: 06/14/14 General:   Alert,  Well-developed, well-nourished, pleasant and cooperative in NAD Head:  Normocephalic and atraumatic. Eyes:  Sclera clear, no icterus.   Conjunctiva pink. Ears:  Normal auditory acuity. Nose:  No deformity, discharge,  or lesions. Mouth:  No deformity or lesions.  Oropharynx pink & moist. Neck:   Supple; no masses or thyromegaly. Lungs:  Clear throughout to auscultation.   No wheezes, crackles, or rhonchi. No acute distress. Heart:  Regular rate and rhythm; no murmurs, clicks, rubs,  or gallops. Abdomen:  Soft, non-distended, mild epigastric tenderness. No masses, hepatosplenomegaly or hernias noted. Normal bowel sounds, without guarding,  and without rebound.     Msk:  Symmetrical without gross deformities. Normal posture. Pulses:  Normal pulses noted. Extremities:  Without clubbing or edema. Neurologic:  Alert and  oriented x4;  grossly normal neurologically. Skin:  Multiple tattoos;  Otherwise intact without significant lesions or rashes. Cervical Nodes:  No significant cervical adenopathy. Psych:  Alert and cooperative. Normal mood and affect.   Lab Results:  Recent Labs  06/14/14 1006 06/15/14 0548  WBC 12.0* 10.2  HGB 18.0* 15.1  HCT 50.2 43.9  PLT 293 257   BMET  Recent Labs  06/14/14 1006 06/15/14 0548  NA 136 136  K 3.7 4.1  CL 101 103  CO2 24 25  GLUCOSE 111* 89  BUN 11 10  CREATININE 0.80 0.80  CALCIUM 9.6 8.6*   LFT  Recent Labs  06/15/14 0548  PROT 6.6  ALBUMIN 3.9  AST 44*  ALT 82*  ALKPHOS 48  BILITOT 0.8   PT/INR No results for input(s): LABPROT, INR in the last 72 hours.  Studies/Results: Nm Hepatobiliary Liver Func  06/15/2014   CLINICAL DATA:  29 year old male with abdominal pain, nausea and vomiting for 10 days. History of hepatitis-C.  EXAM: NUCLEAR MEDICINE HEPATOBILIARY IMAGING  TECHNIQUE: Sequential images of the abdomen were obtained out to 60 minutes following intravenous administration of radiopharmaceutical.  RADIOPHARMACEUTICALS:  5.2 mCi Technetium-69m Choletec IV  COMPARISON:  None.  FINDINGS: Prompt homogeneous hepatic activity is identified.  Small bowel activity is noted 15 min.  Gallbladder activity is noted at 45 min.  No other abnormalities are identified.  IMPRESSION: No evidence of cystic duct obstruction/ acute  cholecystitis.  Slightly delayed gallbladder activity which may represent biliary dyskinesia.   Electronically Signed   By: Harmon Pier M.D.   On: 06/15/2014 16:37   Impression:  1.  Intermittent but longstanding epigastric pain.  Extensive considerations include esophagogastritis, gallbladder (clinical symptoms not readily consistent with this), chronic pancreatitis (EtoH-mediated), other. 2.  Chronic Hepatitis C, no prior treatment. 3.  Alcohol use/abuse. 4.  Nausea and vomiting. 5.  Anxiety/Depression. 6.  Weight loss, due to #1 above.  Plan:  1.  Advance diet to soft, bland. (pain is improving). 2.  PPI + sucralfate. 3.  Endoscopy tomorrow for further investigation. 4.  Risks (bleeding, infection, bowel perforation that could require surgery, sedation-related changes in cardiopulmonary systems), benefits (identification and possible treatment of source of symptoms, exclusion of certain causes of symptoms), and alternatives (watchful waiting, radiographic imaging studies, empiric medical treatment) of upper endoscopy (EGD) were explained to patient/family in detail and patient wishes to proceed.     Freddy Jaksch  06/16/2014, 12:48 PM  Pager [phone] If no answer or after 5 PM call [phone]

## 2014-06-16 NOTE — Progress Notes (Addendum)
Patient Demographics  Jeff Browning, is a 29 y.o. male, DOB - [birth year], WUJ:[id number]  Admit date - 06/14/2014   Admitting Physician Haydee Monicaachal A David, MD  Outpatient Primary MD for the patient is No PCP Per Patient  LOS -    Chief Complaint  Patient presents with  . Abdominal Pain  . Emesis        Subjective:   Jeff Browning today has, No headache, No chest pain, minimal epigastric abdominal pain - No Nausea, No new weakness tingling or numbness, No Cough - SOB.    Assessment & Plan    1. Recurrent abdominal pain which appears to be epigastric with some nausea vomiting. Currently resolved, history of heavy alcohol abuse could have cause gastritis and symptoms, lipase is stable, placed on PPI, HIDA scan ordered upon admission is unremarkable, CT scan done 1 week ago raises the possibility of possible colitis, there is also family history of ulcerative colitis.  Patient's clinical presentation is more consistent with gastritis however underlying colitis cannot be ruled out, he has had similar GI complaints for the last 4 years with multiple visits to the ER without any appropriate workup. Will continue IV PPI, full liquid diet, have requested GI physician Dr. Dulce Sellaroutlaw to evaluate I think he will require EGD along with possibly colonoscopy to once and for all complete his GI workup.   2. Alcohol abuse. Counseled to quit, continue CIWA protocol. Placed on Librium. Request social work to provide him outpatient resources. No signs of DT.   3. Anxiety and depression. Continue Xanax along with other home medications unchanged .   4. Mildly elevated liver enzymes. Likely due to history of hep C and alcohol use. CT scan abdomen and pelvis unremarkable. Monitor clinically. GI requested to  see.     Code Status: Full  Family Communication: none  Disposition Plan: Home 1-2 days   Consults  GI - Outlaw   Procedures   HIDA - unremarkable  Recent CT scan abdomen and pelvis done 1 week ago shows questionable possible colitis, otherwise unremarkable   DVT Prophylaxis  Lovenox    Lab Results  Component Value Date   PLT 257 06/15/2014    Medications  Scheduled Meds: . alum & mag hydroxide-simeth  30 mL Oral TID  . chlordiazePOXIDE  10 mg Oral TID  . enoxaparin (LOVENOX) injection  40 mg Subcutaneous Q24H  . folic acid  1 mg Oral Daily  . multivitamin with minerals  1 tablet Oral Daily  . pantoprazole (PROTONIX) IV  40 mg Intravenous Q12H  . thiamine  100 mg Oral Daily   Or  . thiamine  100 mg Intravenous Daily  . traZODone  50 mg Oral QHS   Continuous Infusions:  PRN Meds:.ALPRAZolam, HYDROmorphone (DILAUDID) injection, LORazepam **OR** LORazepam, promethazine  Antibiotics     Anti-infectives    None        Objective:   Filed Vitals:   06/15/14 0530 06/15/14 1354 06/15/14 2155 06/16/14 0525  BP: 136/84 103/55 119/77 98/54  Pulse: 112 48 63 52  Temp: 98.8 F (37.1 C) 99 F (37.2 C) 98.8 F (37.1 C) 98.2 F (36.8 C)  TempSrc: Oral Oral Oral Oral  Resp: 18 18 18 18   Height:  Weight:      SpO2: 99% 100% 100% 100%    Wt Readings from Last 3 Encounters:  06/14/14 68.04 kg (150 lb)  06/09/14 68.04 kg (150 lb)  02/16/13 74.844 kg (165 lb)     Intake/Output Summary (Last 24 hours) at 06/16/14 0915 Last data filed at 06/16/14 0500  Gross per 24 hour  Intake    840 ml  Output   1602 ml  Net   -762 ml     Physical Exam  Awake Alert, Oriented X 3, No new F.N deficits, Normal affect Taylor.AT,PERRAL Supple Neck,No JVD, No cervical lymphadenopathy appriciated.  Symmetrical Chest wall movement, Good air movement bilaterally, CTAB RRR,No Gallops,Rubs or new Murmurs, No Parasternal Heave +ve B.Sounds, Abd Soft, mild epigastric  tenderness, No organomegaly appriciated, No rebound - guarding or rigidity. No Cyanosis, Clubbing or edema, No new Rash or bruise      Data Review   Micro Results No results found for this or any previous visit (from the past 240 hour(s)).  Radiology Reports Nm Hepatobiliary Liver Func  06/15/2014   CLINICAL DATA:  29 year old male with abdominal pain, nausea and vomiting for 10 days. History of hepatitis-C.  EXAM: NUCLEAR MEDICINE HEPATOBILIARY IMAGING  TECHNIQUE: Sequential images of the abdomen were obtained out to 60 minutes following intravenous administration of radiopharmaceutical.  RADIOPHARMACEUTICALS:  5.2 mCi Technetium-5057m Choletec IV  COMPARISON:  None.  FINDINGS: Prompt homogeneous hepatic activity is identified.  Small bowel activity is noted 15 min.  Gallbladder activity is noted at 45 min.  No other abnormalities are identified.  IMPRESSION: No evidence of cystic duct obstruction/ acute cholecystitis.  Slightly delayed gallbladder activity which may represent biliary dyskinesia.   Electronically Signed   By: Harmon PierJeffrey  Hu M.D.   On: 06/15/2014 16:37   Ct Abdomen Pelvis W Contrast  06/09/2014   CLINICAL DATA:  Nausea and vomiting. Symptoms for several days. Diffuse abdominal pain. Hepatitis C.  EXAM: CT ABDOMEN AND PELVIS WITH CONTRAST  TECHNIQUE: Multidetector CT imaging of the abdomen and pelvis was performed using the standard protocol following bolus administration of intravenous contrast.  CONTRAST:  50mL OMNIPAQUE IOHEXOL 300 MG/ML SOLN, 100mL OMNIPAQUE IOHEXOL 300 MG/ML SOLN  COMPARISON:  04/08/2012.  FINDINGS: Musculoskeletal:  Normal.  Lung Bases: Clear.  Liver: Tiny low-density lesion in the RIGHT hepatic lobe likely represents a tiny cysts.  Spleen:  Normal.  Gallbladder:  Normal.  Common bile duct:  Normal.  Pancreas:  Normal.  Adrenal glands:  Normal bilaterally.  Kidneys: Normal enhancement. LEFT ureter appears normal. RIGHT ureter appears normal.  Stomach:  Normal.  Small  bowel: Normal. No small bowel obstruction. No mesenteric adenopathy.  Colon: Normal appendix. Most of the colon is collapsed with mural thickening likely secondary to underdistention. No obstruction or inflammatory changes.  Pelvic Genitourinary:  Normal urinary bladder.  Peritoneum: Tiny amount of free fluid in the anatomic pelvis which is abnormal but nonspecific.  Vascular/lymphatic: Normal.  Body Wall: Normal.  IMPRESSION: 1. Tiny amount of nonspecific intra-abdominal free fluid in the anatomic pelvis. 2. Most of the colon is collapsed which probably accounts for mild mural thickening. Colitis is considered less likely.   Electronically Signed   By: Andreas NewportGeoffrey  Lamke M.D.   On: 06/09/2014 14:15   Dg Abd Acute W/chest  06/14/2014   CLINICAL DATA:  Cough. Nausea and vomiting. Mid abdominal pain beginning 2 days ago.  EXAM: DG ABDOMEN ACUTE W/ 1V CHEST  COMPARISON:  CT the abdomen and pelvis 06/09/2014.  FINDINGS: There is no evidence of dilated bowel loops or free intraperitoneal air. No radiopaque calculi or other significant radiographic abnormality is seen. Heart size and mediastinal contours are within normal limits. Both lungs are clear.  IMPRESSION: Negative abdominal radiographs.  No acute cardiopulmonary disease.   Electronically Signed   By: Marin Roberts M.D.   On: 06/14/2014 11:01     CBC  Recent Labs Lab 06/09/14 0925 06/14/14 1006 06/15/14 0548  WBC 13.7* 12.0* 10.2  HGB 18.7* 18.0* 15.1  HCT 51.6 50.2 43.9  PLT 294 293 257  MCV 85.7 86.4 88.5  MCH 31.1 31.0 30.4  MCHC 36.2* 35.9 34.4  RDW 13.9 13.1 13.1  LYMPHSABS 2.2 1.8  --   MONOABS 1.0 0.7  --   EOSABS 0.1 0.0  --   BASOSABS 0.1 0.0  --     Chemistries   Recent Labs Lab 06/09/14 0925 06/14/14 1006 06/15/14 0548  NA 137 136 136  K 3.5 3.7 4.1  CL 97* 101 103  CO2 GLUCOSE 126* 111* 89  BUN CREATININE 0.87 0.80 0.80  CALCIUM 9.7 9.6 8.6*  AST 59* 53* 44*  ALT 116* 107* 82*  ALKPHOS  72 61 48  BILITOT 1.1 1.0 0.8   ------------------------------------------------------------------------------------------------------------------ estimated creatinine clearance is 134.6 mL/min (by C-G formula based on Cr of 0.8). ------------------------------------------------------------------------------------------------------------------ No results for input(s): HGBA1C in the last 72 hours. ------------------------------------------------------------------------------------------------------------------ No results for input(s): CHOL, HDL, LDLCALC, TRIG, CHOLHDL, LDLDIRECT in the last 72 hours. ------------------------------------------------------------------------------------------------------------------ No results for input(s): TSH, T4TOTAL, T3FREE, THYROIDAB in the last 72 hours.  Invalid input(s): FREET3 ------------------------------------------------------------------------------------------------------------------ No results for input(s): VITAMINB12, FOLATE, FERRITIN, TIBC, IRON, RETICCTPCT in the last 72 hours.  Coagulation profile No results for input(s): INR, PROTIME in the last 168 hours.  No results for input(s): DDIMER in the last 72 hours.  Cardiac Enzymes No results for input(s): CKMB, TROPONINI, MYOGLOBIN in the last 168 hours.  Invalid input(s): CK ------------------------------------------------------------------------------------------------------------------ Invalid input(s): POCBNP   Time Spent in minutes   35   Susa Raring K M.D on 06/16/2014 at 9:15 AM  Between 7am to 7pm - Pager - [phone]  After 7pm go to [email or web address] - password [facility]  [facility]   Office  [phone]

## 2014-06-17 ENCOUNTER — Observation Stay (HOSPITAL_COMMUNITY): Payer: Medicaid Other | Admitting: Certified Registered"

## 2014-06-17 ENCOUNTER — Encounter (HOSPITAL_COMMUNITY)
Admission: EM | Disposition: A | Discharge status: Home / Self Care | Payer: Self-pay | Source: Home / Self Care | Attending: Emergency Medicine

## 2014-06-17 ENCOUNTER — Encounter (HOSPITAL_COMMUNITY): Payer: Self-pay

## 2014-06-17 DIAGNOSIS — F411 Generalized anxiety disorder: Secondary | ICD-10-CM | POA: Diagnosis not present

## 2014-06-17 DIAGNOSIS — G43A1 Cyclical vomiting, intractable: Secondary | ICD-10-CM | POA: Diagnosis not present

## 2014-06-17 DIAGNOSIS — F1029 Alcohol dependence with unspecified alcohol-induced disorder: Secondary | ICD-10-CM | POA: Diagnosis not present

## 2014-06-17 DIAGNOSIS — K259 Gastric ulcer, unspecified as acute or chronic, without hemorrhage or perforation: Secondary | ICD-10-CM | POA: Diagnosis not present

## 2014-06-17 DIAGNOSIS — F1020 Alcohol dependence, uncomplicated: Secondary | ICD-10-CM | POA: Diagnosis not present

## 2014-06-17 DIAGNOSIS — R1084 Generalized abdominal pain: Secondary | ICD-10-CM | POA: Diagnosis not present

## 2014-06-17 DIAGNOSIS — R1110 Vomiting, unspecified: Secondary | ICD-10-CM | POA: Diagnosis not present

## 2014-06-17 DIAGNOSIS — K2950 Unspecified chronic gastritis without bleeding: Secondary | ICD-10-CM | POA: Diagnosis not present

## 2014-06-17 DIAGNOSIS — Z886 Allergy status to analgesic agent status: Secondary | ICD-10-CM | POA: Diagnosis not present

## 2014-06-17 DIAGNOSIS — K210 Gastro-esophageal reflux disease with esophagitis: Secondary | ICD-10-CM | POA: Diagnosis not present

## 2014-06-17 HISTORY — PX: ESOPHAGOGASTRODUODENOSCOPY (EGD) WITH PROPOFOL: SHX5813

## 2014-06-17 LAB — COMPREHENSIVE METABOLIC PANEL
ALBUMIN: 3.9 g/dL (ref 3.5–5.0)
ALT: 102 U/L — AB (ref 17–63)
AST: 50 U/L — ABNORMAL HIGH (ref 15–41)
Alkaline Phosphatase: 50 U/L (ref 38–126)
Anion gap: 7 (ref 5–15)
BUN: 8 mg/dL (ref 6–20)
CO2: 31 mmol/L (ref 22–32)
Calcium: 8.8 mg/dL — ABNORMAL LOW (ref 8.9–10.3)
Chloride: 101 mmol/L (ref 101–111)
Creatinine, Ser: 0.91 mg/dL (ref 0.61–1.24)
GFR calc Af Amer: >60 mL/min (ref >60)
GFR calc non Af Amer: >60 mL/min (ref >60)
GLUCOSE: 99 mg/dL (ref 65–99)
Potassium: 3.9 mmol/L (ref 3.5–5.1)
Sodium: 139 mmol/L (ref 135–145)
TOTAL PROTEIN: 6.3 g/dL — AB (ref 6.5–8.1)
Total Bilirubin: 0.6 mg/dL (ref 0.3–1.2)

## 2014-06-17 LAB — CBC
HCT: 43.6 % (ref 39.0–52.0)
Hemoglobin: 14.7 g/dL (ref 13.0–17.0)
MCH: 29.8 pg (ref 26.0–34.0)
MCHC: 33.7 g/dL (ref 30.0–36.0)
MCV: 88.3 fL (ref 78.0–100.0)
PLATELETS: 277 10*3/uL (ref 150–400)
RBC: 4.94 MIL/uL (ref 4.22–5.81)
RDW: 12.9 % (ref 11.5–15.5)
WBC: 7.3 10*3/uL (ref 4.0–10.5)

## 2014-06-17 SURGERY — ESOPHAGOGASTRODUODENOSCOPY (EGD) WITH PROPOFOL
Anesthesia: Monitor Anesthesia Care | Laterality: Left

## 2014-06-17 MED ORDER — MIDAZOLAM HCL 5 MG/5ML IJ SOLN
INTRAMUSCULAR | Status: DC | PRN
  Administered 2014-06-17: 2 mg via INTRAVENOUS

## 2014-06-17 MED ORDER — LACTATED RINGERS IV SOLN
INTRAVENOUS | Status: DC | PRN
  Administered 2014-06-17: 12:00:00 via INTRAVENOUS

## 2014-06-17 MED ORDER — SODIUM CHLORIDE 0.9 % IV SOLN: INTRAVENOUS | Status: DC

## 2014-06-17 MED ORDER — MIDAZOLAM HCL 2 MG/2ML IJ SOLN
INTRAMUSCULAR | Status: AC
  Filled 2014-06-17: qty 2

## 2014-06-17 MED ORDER — PROPOFOL 10 MG/ML IV BOLUS
INTRAVENOUS | Status: AC
  Filled 2014-06-17: qty 20

## 2014-06-17 MED ORDER — CHLORDIAZEPOXIDE HCL 5 MG PO CAPS
5.0000 mg | ORAL_CAPSULE | Freq: Two times a day (BID) | ORAL | Status: DC
  Administered 2014-06-18: 5 mg via ORAL
  Filled 2014-06-17 (×2): qty 1

## 2014-06-17 MED ORDER — PROPOFOL 10 MG/ML IV BOLUS
INTRAVENOUS | Status: DC | PRN
  Administered 2014-06-17: 50 mg via INTRAVENOUS
  Administered 2014-06-17: 150 mg via INTRAVENOUS

## 2014-06-17 SURGICAL SUPPLY — 15 items

## 2014-06-17 NOTE — Progress Notes (Signed)
Pt arrived back to unit with Beryle BeamsJanie Billups, RN from endoscopy. Pt in NAD. Pt ambulated back into his bed. Father at bedside with pt. Pt has not complaints other than still in pain. Pt informed that he can receive another dose of Dilaudid in one hour. Pt educated on change of diet and was given the menu to order from. Pt has no further questions.

## 2014-06-17 NOTE — Progress Notes (Signed)
[facility] Progress Note  Subjective: Still with abdominal pain and nausea  Objective: Vital signs in last 24 hours: Temp:  [97.7 F (36.5 C)-99 F (37.2 C)] 98.7 F (37.1 C) (05/16 1205) Pulse Rate:  [52-65] 52 (05/16 1205) Resp:  [8-18] 8 (05/16 1205) BP: (96-124)/(55-72) 124/68 mmHg (05/16 1205) SpO2:  [97 %-100 %] 97 % (05/16 1205) Weight:  [65.318 kg (144 lb)] 65.318 kg (144 lb) (05/16 1205) Weight change:    PE: Abdomen soft tender in the epigastrium  Lab Results: Results for orders placed or performed during the hospital encounter of 06/14/14 (from the past 24 hour(s))  CBC     Status: None   Collection Time: 06/17/14  5:14 AM  Result Value Ref Range   WBC 7.3 4.0 - 10.5 K/uL   RBC 4.94 4.22 - 5.81 MIL/uL   Hemoglobin 14.7 13.0 - 17.0 g/dL   HCT 40.943.6 81.139.0 - 91.452.0 %   MCV 88.3 78.0 - 100.0 fL   MCH 29.8 26.0 - 34.0 pg   MCHC 33.7 30.0 - 36.0 g/dL   RDW 78.212.9 95.611.5 - 21.315.5 %   Platelets 277 150 - 400 K/uL  Comprehensive metabolic panel     Status: Abnormal   Collection Time: 06/17/14  5:14 AM  Result Value Ref Range   Sodium 139 135 - 145 mmol/L   Potassium 3.9 3.5 - 5.1 mmol/L   Chloride 101 101 - 111 mmol/L   CO2 31 22 - 32 mmol/L   Glucose, Bld 99 65 - 99 mg/dL   BUN 8 6 - 20 mg/dL   Creatinine, Ser 0.860.91 0.61 - 1.24 mg/dL   Calcium 8.8 (L) 8.9 - 10.3 mg/dL   Total Protein 6.3 (L) 6.5 - 8.1 g/dL   Albumin 3.9 3.5 - 5.0 g/dL   AST 50 (H) 15 - 41 U/L   ALT 102 (H) 17 - 63 U/L   Alkaline Phosphatase 50 38 - 126 U/L   Total Bilirubin 0.6 0.3 - 1.2 mg/dL   GFR calc non Af Amer >60 >60 mL/min   GFR calc Af Amer >60 >60 mL/min   Anion gap 7 5 - 15    Studies/Results: Nm Hepatobiliary Liver Func  06/15/2014   CLINICAL DATA:  29 year old male with abdominal pain, nausea and vomiting for 10 days. History of hepatitis-C.  EXAM: NUCLEAR MEDICINE HEPATOBILIARY IMAGING  TECHNIQUE: Sequential images of the abdomen were obtained out to 60 minutes following  intravenous administration of radiopharmaceutical.  RADIOPHARMACEUTICALS:  5.2 mCi Technetium-830m Choletec IV  COMPARISON:  None.  FINDINGS: Prompt homogeneous hepatic activity is identified.  Small bowel activity is noted 15 min.  Gallbladder activity is noted at 45 min.  No other abnormalities are identified.  IMPRESSION: No evidence of cystic duct obstruction/ acute cholecystitis.  Slightly delayed gallbladder activity which may represent biliary dyskinesia.   Electronically Signed   By: Harmon PierJeffrey  Hu M.D.   On: 06/15/2014 16:37      Assessment: Nausea vomiting and abdominal pain. EGD showed a small antral ulcer and atypical of moderately severe segment of distal esophagitis somewhat suspicious for pill-induced esophagitis or possibly other etiology such as viral or eosinophilic given the patient's age and gender  Plan: Continue PPI therapy plus Carafate while awaiting antral and esophageal biopsies.    Ercole Georg C 06/17/2014, 1:13 PM

## 2014-06-17 NOTE — Anesthesia Postprocedure Evaluation (Signed)
Anesthesia Post Note  Patient: Jeff Browning  Procedure(s) Performed: Procedure(s) (LRB): ESOPHAGOGASTRODUODENOSCOPY (EGD) WITH PROPOFOL (Left)  Anesthesia type: general  Patient location: PACU  Post pain: Pain level controlled  Post assessment: Patient's Cardiovascular Status Stable  Last Vitals:  Filed Vitals:   06/17/14 1358  BP: 107/68  Pulse: 60  Temp: 37 C  Resp: 18    Post vital signs: Reviewed and stable  Level of consciousness: sedated  Complications: No apparent anesthesia complications

## 2014-06-17 NOTE — Transfer of Care (Signed)
Immediate Anesthesia Transfer of Care Note  Patient: Jeff Browning  Procedure(s) Performed: Procedure(s): ESOPHAGOGASTRODUODENOSCOPY (EGD) WITH PROPOFOL (Left)  Patient Location: PACU  Anesthesia Type:MAC  Level of Consciousness:  sedated, patient cooperative and responds to stimulation  Airway & Oxygen Therapy:Patient Spontanous Breathing and Patient connected to face mask oxgen  Post-op Assessment:  Report given to PACU RN and Post -op Vital signs reviewed and stable  Post vital signs:  Reviewed and stable  Last Vitals:  Filed Vitals:   06/17/14 1205  BP: 124/68  Pulse: 52  Temp: 37.1 C  Resp: 8    Complications: No apparent anesthesia complications

## 2014-06-17 NOTE — Op Note (Addendum)
[facility] [address] [place], [id number]   ENDOSCOPY PROCEDURE REPORT  PATIENT: [name], Jeff T  MR#: [id number] BIRTHDATE: 06-07-1985 , 28  yrs. old GENDER: male ENDOSCOPIST:Wilburta Milbourn Madilyn FiremanHayes, MD REFERRED BY: PROCEDURE DATE:  06/17/2014 PROCEDURE: ASA CLASS: INDICATIONS: epigastric pain nausea and vomiting MEDICATION: MAC TOPICAL ANESTHETIC:  DESCRIPTION OF PROCEDURE:   After the risks and benefits of the procedure were explained, informed consent was obtained.  The Pentax       endoscope was introduced through the mouth  and advanced to the second portion of the duodenum .  The instrument was slowly withdrawn as the mucosa was fully examined. Estimated blood loss is zero unless otherwise noted in this procedure report.    atypical esophagitis with atypical granular ulceration in a crescent shaped in the distal esophagus but not involving the GE junction. Differential diagnosis could include pill-induced, eosinophilic or viral as well as reflux. Biopsies were taken. Stomach: Small cratered ulcer in the distal body or proximal antrum along the greater curvature with no stigmata of hemorrhage. Mild antral gastritis biopsied for H. pylori. Duodenum normal.               The scope was then withdrawn from the patient and the procedure completed.  COMPLICATIONS: There were no immediate complications.  ENDOSCOPIC IMPRESSION: atypical appearing esophagitis differential includes infectious versus pill induced versus eosinophilic as well as reflux Small gastric ulcer with mild gastritis RECOMMENDATIONS: treated with double dose proton pump inhibitor while awaiting biopsies.   _______________________________ Rosalie DoctoreSignedDorena Cookey:  Orange Hilligoss, MD 06/17/2014 1:09 PM Revised: 06/17/2014 1:09 PM    cc:  CPT CODES: ICD CODES:  The ICD and CPT codes recommended by this software are interpretations from the data that the clinical staff has captured with the software.   The verification of the translation of this report to the ICD and CPT codes and modifiers is the sole responsibility of the health care institution and practicing physician where this report was generated.  [employer] will not be held responsible for the validity of the ICD and CPT codes included on this report.  AMA assumes no liability for data contained or not contained herein. CPT is a Publishing rights managerregistered trademark of the [employer].

## 2014-06-17 NOTE — Progress Notes (Signed)
PATIENT DETAILS Name: Jeff Browning Age: 29 y.o. Sex: male Date of Birth: 01/25/1986 Admit Date: 06/14/2014 Admitting Physician Haydee Monica, MD PCP:No PCP Per Patient  Subjective: Continues have some amount of epigastric pain-but clearly better than on admission. Tolerating diet well.  Assessment/Plan: Principal Problem:   Recurrent epigastric pain: Admitted and provided with supportive measures. CT of the abdomen on 5/8 negative for acute abnormalities. GI consulted, underwent EGD on 5/16 which showed a small antral ulcer and atypical of moderately severe segment of distal esophagitis-possibly prerenal induced esophagitis or viral/eosinophilic esophagitis. Recommendations are to continue with PPI plus Carafate and await biopsies.GI recommends additional day of hospitalization.  Active Problems:   Alcohol abuse: No signs of withdrawal, taper off Librium, continue with Ativan per protocol.     Anxiety with depression: Stable continue with Xanax and trazodone. Denies any suicidal/homicidal ideation at present.  Disposition: Remain inpatient  Antimicrobial agents  See below  Anti-infectives    None      DVT Prophylaxis: Prophylactic Lovenox   Code Status: Full code  Family Communication None at bedside  Procedures: None  CONSULTS:  GI  Time spent 25 minutes-Greater than 50% of this time was spent in counseling, explanation of diagnosis, planning of further management, and coordination of care.  MEDICATIONS: Scheduled Meds: . alum & mag hydroxide-simeth  30 mL Oral TID  . chlordiazePOXIDE  10 mg Oral TID  . enoxaparin (LOVENOX) injection  40 mg Subcutaneous Q24H  . folic acid  1 mg Oral Daily  . multivitamin with minerals  1 tablet Oral Daily  . pantoprazole (PROTONIX) IV  40 mg Intravenous Q12H  . sucralfate  1 g Oral TID WC & HS  . thiamine  100 mg Oral Daily   Or  . thiamine  100 mg Intravenous Daily  . traZODone  50 mg Oral QHS     Continuous Infusions:  PRN Meds:.ALPRAZolam, HYDROcodone-acetaminophen, HYDROmorphone (DILAUDID) injection, LORazepam **OR** LORazepam, promethazine    PHYSICAL EXAM: Vital signs in last 24 hours: Filed Vitals:   06/17/14 1310 06/17/14 1320 06/17/14 1330 06/17/14 1358  BP: 109/59 116/72 114/71 107/68  Pulse: 70 58 53 60  Temp:    98.6 F (37 C)  TempSrc:    Oral  Resp: Height:      Weight:      SpO2: 98% 100% 96% 99%    Weight change:  Filed Weights   06/14/14 1729 06/17/14 1205  Weight: 68.04 kg (150 lb) 65.318 kg (144 lb)   Body mass index is 21.26 kg/(m^2).   Gen Exam: Awake and alert with clear speech.   Neck: Supple, No JVD.   Chest: B/L Clear.   CVS: S1 S2 Regular, no murmurs.  Abdomen: soft, BS +,mildly tender epigastric area, non distended.  Extremities: no edema, lower extremities warm to touch Neurologic: Non Focal.   Skin: No Rash.   Wounds: N/A.   Intake/Output from previous day:  Intake/Output Summary (Last 24 hours) at 06/17/14 1608 Last data filed at 06/17/14 1358  Gross per 24 hour  Intake    300 ml  Output      0 ml  Net    300 ml     LAB RESULTS: CBC  Recent Labs Lab 06/14/14 1006 06/15/14 0548 06/17/14 0514  WBC 12.0* 10.2 7.3  HGB 18.0* 15.1 14.7  HCT 50.2 43.9 43.6  PLT 293  257 277  MCV 86.4 88.5 88.3  MCH 31.0 30.4 29.8  MCHC 35.9 34.4 33.7  RDW 13.1 13.1 12.9  LYMPHSABS 1.8  --   --   MONOABS 0.7  --   --   EOSABS 0.0  --   --   BASOSABS 0.0  --   --     Chemistries   Recent Labs Lab 06/14/14 1006 06/15/14 0548 06/17/14 0514  NA 136 136 139  K 3.7 4.1 3.9  CL 101 103 101  CO2 24 25 31   GLUCOSE 111* 89 99  BUN 11 10 8   CREATININE 0.80 0.80 0.91  CALCIUM 9.6 8.6* 8.8*    CBG: No results for input(s): GLUCAP in the last 168 hours.  GFR Estimated Creatinine Clearance: 113.6 mL/min (by C-G formula based on Cr of 0.91).  Coagulation profile No results for input(s): INR, PROTIME in the last  168 hours.  Cardiac Enzymes No results for input(s): CKMB, TROPONINI, MYOGLOBIN in the last 168 hours.  Invalid input(s): CK  Invalid input(s): POCBNP No results for input(s): DDIMER in the last 72 hours. No results for input(s): HGBA1C in the last 72 hours. No results for input(s): CHOL, HDL, LDLCALC, TRIG, CHOLHDL, LDLDIRECT in the last 72 hours. No results for input(s): TSH, T4TOTAL, T3FREE, THYROIDAB in the last 72 hours.  Invalid input(s): FREET3 No results for input(s): VITAMINB12, FOLATE, FERRITIN, TIBC, IRON, RETICCTPCT in the last 72 hours.  Recent Labs  06/15/14 0548  LIPASE 22    Urine Studies No results for input(s): UHGB, CRYS in the last 72 hours.  Invalid input(s): UACOL, UAPR, USPG, UPH, UTP, UGL, UKET, UBIL, UNIT, UROB, ULEU, UEPI, UWBC, URBC, UBAC, CAST, UCOM, BILUA  MICROBIOLOGY: No results found for this or any previous visit (from the past 240 hour(s)).  RADIOLOGY STUDIES/RESULTS: Nm Hepatobiliary Liver Func  06/15/2014   CLINICAL DATA:  29 year old male with abdominal pain, nausea and vomiting for 10 days. History of hepatitis-C.  EXAM: NUCLEAR MEDICINE HEPATOBILIARY IMAGING  TECHNIQUE: Sequential images of the abdomen were obtained out to 60 minutes following intravenous administration of radiopharmaceutical.  RADIOPHARMACEUTICALS:  5.2 mCi Technetium-5157m Choletec IV  COMPARISON:  None.  FINDINGS: Prompt homogeneous hepatic activity is identified.  Small bowel activity is noted 15 min.  Gallbladder activity is noted at 45 min.  No other abnormalities are identified.  IMPRESSION: No evidence of cystic duct obstruction/ acute cholecystitis.  Slightly delayed gallbladder activity which may represent biliary dyskinesia.   Electronically Signed   By: Harmon PierJeffrey  Hu M.D.   On: 06/15/2014 16:37   Ct Abdomen Pelvis W Contrast  06/09/2014   CLINICAL DATA:  Nausea and vomiting. Symptoms for several days. Diffuse abdominal pain. Hepatitis C.  EXAM: CT ABDOMEN AND PELVIS  WITH CONTRAST  TECHNIQUE: Multidetector CT imaging of the abdomen and pelvis was performed using the standard protocol following bolus administration of intravenous contrast.  CONTRAST:  50mL OMNIPAQUE IOHEXOL 300 MG/ML SOLN, 100mL OMNIPAQUE IOHEXOL 300 MG/ML SOLN  COMPARISON:  04/08/2012.  FINDINGS: Musculoskeletal:  Normal.  Lung Bases: Clear.  Liver: Tiny low-density lesion in the RIGHT hepatic lobe likely represents a tiny cysts.  Spleen:  Normal.  Gallbladder:  Normal.  Common bile duct:  Normal.  Pancreas:  Normal.  Adrenal glands:  Normal bilaterally.  Kidneys: Normal enhancement. LEFT ureter appears normal. RIGHT ureter appears normal.  Stomach:  Normal.  Small bowel: Normal. No small bowel obstruction. No mesenteric adenopathy.  Colon: Normal appendix. Most of the colon is collapsed  with mural thickening likely secondary to underdistention. No obstruction or inflammatory changes.  Pelvic Genitourinary:  Normal urinary bladder.  Peritoneum: Tiny amount of free fluid in the anatomic pelvis which is abnormal but nonspecific.  Vascular/lymphatic: Normal.  Body Wall: Normal.  IMPRESSION: 1. Tiny amount of nonspecific intra-abdominal free fluid in the anatomic pelvis. 2. Most of the colon is collapsed which probably accounts for mild mural thickening. Colitis is considered less likely.   Electronically Signed   By: Andreas NewportGeoffrey  Lamke M.D.   On: 06/09/2014 14:15   Dg Abd Acute W/chest  06/14/2014   CLINICAL DATA:  Cough. Nausea and vomiting. Mid abdominal pain beginning 2 days ago.  EXAM: DG ABDOMEN ACUTE W/ 1V CHEST  COMPARISON:  CT the abdomen and pelvis 06/09/2014.  FINDINGS: There is no evidence of dilated bowel loops or free intraperitoneal air. No radiopaque calculi or other significant radiographic abnormality is seen. Heart size and mediastinal contours are within normal limits. Both lungs are clear.  IMPRESSION: Negative abdominal radiographs.  No acute cardiopulmonary disease.   Electronically Signed    By: Marin Robertshristopher  Mattern M.D.   On: 06/14/2014 11:01    Jeoffrey MassedGHIMIRE,SHANKER, MD  [facility] Pager:[phone]  If 7PM-7AM, please contact night-coverage [email or web address] Password [email or web address] 06/17/2014, 4:08 PM

## 2014-06-17 NOTE — Anesthesia Preprocedure Evaluation (Addendum)
Anesthesia Evaluation  Patient identified by MRN, date of birth, ID band Patient awake    Reviewed: Allergy & Precautions, NPO status , Patient's Chart, lab work & pertinent test results  Airway Mallampati: I  TM Distance: >3 FB Neck ROM: Full    Dental   Pulmonary Current Smoker,    Pulmonary exam normal       Cardiovascular Normal cardiovascular exam    Neuro/Psych    GI/Hepatic (+) Hepatitis -  Endo/Other    Renal/GU      Musculoskeletal   Abdominal   Peds  Hematology   Anesthesia Other Findings   Reproductive/Obstetrics                            Anesthesia Physical Anesthesia Plan  ASA: II  Anesthesia Plan: MAC   Post-op Pain Management:    Induction: Intravenous  Airway Management Planned: Natural Airway  Additional Equipment:   Intra-op Plan:   Post-operative Plan:   Informed Consent: I have reviewed the patients History and Physical, chart, labs and discussed the procedure including the risks, benefits and alternatives for the proposed anesthesia with the patient or authorized representative who has indicated his/her understanding and acceptance.     Plan Discussed with: CRNA and Surgeon  Anesthesia Plan Comments:         Anesthesia Quick Evaluation

## 2014-06-18 ENCOUNTER — Encounter (HOSPITAL_COMMUNITY): Payer: Self-pay | Admitting: Gastroenterology

## 2014-06-18 DIAGNOSIS — R1084 Generalized abdominal pain: Secondary | ICD-10-CM | POA: Diagnosis not present

## 2014-06-18 DIAGNOSIS — G43A1 Cyclical vomiting, intractable: Secondary | ICD-10-CM | POA: Diagnosis not present

## 2014-06-18 DIAGNOSIS — F1020 Alcohol dependence, uncomplicated: Secondary | ICD-10-CM | POA: Diagnosis not present

## 2014-06-18 MED ORDER — ONDANSETRON HCL 4 MG PO TABS: 4.0000 mg | ORAL_TABLET | Freq: Four times a day (QID) | ORAL | Status: DC

## 2014-06-18 MED ORDER — TRAZODONE HCL 50 MG PO TABS: 50.0000 mg | ORAL_TABLET | Freq: Every day | ORAL | Status: DC

## 2014-06-18 MED ORDER — SUCRALFATE 1 GM/10ML PO SUSP: 1.0000 g | Freq: Three times a day (TID) | ORAL | Status: DC

## 2014-06-18 MED ORDER — OXYCODONE HCL 5 MG PO TABS: 5.0000 mg | ORAL_TABLET | Freq: Four times a day (QID) | ORAL | Status: DC | PRN

## 2014-06-18 MED ORDER — OXYCODONE HCL 5 MG PO TABS
5.0000 mg | ORAL_TABLET | Freq: Four times a day (QID) | ORAL | Status: DC | PRN
  Administered 2014-06-18: 5 mg via ORAL
  Filled 2014-06-18: qty 1

## 2014-06-18 MED ORDER — PANTOPRAZOLE SODIUM 40 MG PO TBEC: 40.0000 mg | DELAYED_RELEASE_TABLET | Freq: Two times a day (BID) | ORAL | Status: DC

## 2014-06-18 MED ORDER — ALPRAZOLAM 1 MG PO TABS: 1.0000 mg | ORAL_TABLET | Freq: Every evening | ORAL | Status: AC | PRN

## 2014-06-18 NOTE — Progress Notes (Signed)
[facility] Progress Note  Subjective: Just workup, still complaining of pain but wants to eat  Objective: Vital signs in last 24 hours: Temp:  [98.1 F (36.7 C)-98.7 F (37.1 C)] 98.1 F (36.7 C) (05/17 0640) Pulse Rate:  [52-70] 62 (05/17 0640) Resp:  [8-18] 17 (05/17 0640) BP: (91-124)/(55-72) 97/55 mmHg (05/17 0640) SpO2:  [96 %-100 %] 100 % (05/17 0640) Weight:  [65.318 kg (144 lb)] 65.318 kg (144 lb) (05/16 1205) Weight change:    PE: Abdomen soft mildly tender in epigastrium  Lab Results: No results found for this or any previous visit (from the past 24 hour(s)).  Studies/Results: No results found.    Assessment: Abdominal pain with significant esophagitis as well as a small gastric ulcer seen on EGD  Plan: Continue PPI twice a day probably for at least 2 months Await pathology Advance diet Possibly home today if can be weaned off narcotics I will follow-up in the office.    Karlyn Glasco C 06/18/2014, 8:32 AM

## 2014-06-18 NOTE — Discharge Summary (Signed)
PATIENT DETAILS Name: Jeff Browning Age: 29 y.o. Sex: male Date of Birth: 1986/01/26 MRN: [id number]. Admitting Physician: Haydee Monicaachal A David, MD PCP:No PCP Per Patient  Admit Date: 06/14/2014 Discharge date: 06/18/2014  Recommendations for Outpatient Follow-up:  1. EGD biopsy results still pending-please follow  2. Needs ongoing counseling regarding the importance of continuing to abstain from alcohol and tobacco abuse.  PRIMARY DISCHARGE DIAGNOSIS:  Principal Problem:   Intractable nausea and vomiting Active Problems:   Opiate dependence   Alcohol dependence   Hepatitis C   Generalized anxiety disorder   Chronic abdominal pain   Abdominal pain, acute, generalized      PAST MEDICAL HISTORY: Past Medical History  Diagnosis Date  . Depression   . Chronic abdominal pain   . Anxiety   . Tourette disease   . Hepatitis C   . Alcohol dependence   . Opiate dependence     DISCHARGE MEDICATIONS: Current Discharge Medication List    START taking these medications   Details  oxyCODONE (OXY IR/ROXICODONE) 5 MG immediate release tablet Take 1 tablet (5 mg total) by mouth every 6 (six) hours as needed for moderate pain. Qty: 30 tablet, Refills: 0    pantoprazole (PROTONIX) 40 MG tablet Take 1 tablet (40 mg total) by mouth 2 (two) times daily. Switch for any other PPI at similar dose and frequency Qty: 120 tablet, Refills: 0    sucralfate (CARAFATE) 1 GM/10ML suspension Take 10 mLs (1 g total) by mouth 4 (four) times daily -  with meals and at bedtime. Qty: 420 mL, Refills: 0      CONTINUE these medications which have CHANGED   Details  ALPRAZolam (XANAX) 1 MG tablet Take 1 tablet (1 mg total) by mouth at bedtime as needed for anxiety. Qty: 15 tablet, Refills: 0    ondansetron (ZOFRAN) 4 MG tablet Take 1 tablet (4 mg total) by mouth every 6 (six) hours. Qty: 15 tablet, Refills: 0    traZODone (DESYREL) 50 MG tablet Take 1 tablet (50 mg total) by mouth at  bedtime. Qty: 30 tablet, Refills: 0      STOP taking these medications     promethazine (PHENERGAN) 25 MG tablet      busPIRone (BUSPAR) 10 MG tablet      desvenlafaxine (PRISTIQ) 50 MG 24 hr tablet      fluvoxaMINE (LUVOX) 25 MG tablet         ALLERGIES:   Allergies  Allergen Reactions  . Acetaminophen     Hep C    BRIEF HPI:  See H&P, Labs, Consult and Test reports for all details in brief, patient was admitted for evaluation of epigastric pain.  CONSULTATIONS:   GI  PERTINENT RADIOLOGIC STUDIES: Nm Hepatobiliary Liver Func  06/15/2014   CLINICAL DATA:  29 year old male with abdominal pain, nausea and vomiting for 10 days. History of hepatitis-C.  EXAM: NUCLEAR MEDICINE HEPATOBILIARY IMAGING  TECHNIQUE: Sequential images of the abdomen were obtained out to 60 minutes following intravenous administration of radiopharmaceutical.  RADIOPHARMACEUTICALS:  5.2 mCi Technetium-3010m Choletec IV  COMPARISON:  None.  FINDINGS: Prompt homogeneous hepatic activity is identified.  Small bowel activity is noted 15 min.  Gallbladder activity is noted at 45 min.  No other abnormalities are identified.  IMPRESSION: No evidence of cystic duct obstruction/ acute cholecystitis.  Slightly delayed gallbladder activity which may represent biliary dyskinesia.   Electronically Signed   By: Harmon PierJeffrey  Hu M.D.   On: 06/15/2014 16:37   Ct  Abdomen Pelvis W Contrast  06/09/2014   CLINICAL DATA:  Nausea and vomiting. Symptoms for several days. Diffuse abdominal pain. Hepatitis C.  EXAM: CT ABDOMEN AND PELVIS WITH CONTRAST  TECHNIQUE: Multidetector CT imaging of the abdomen and pelvis was performed using the standard protocol following bolus administration of intravenous contrast.  CONTRAST:  50mL OMNIPAQUE IOHEXOL 300 MG/ML SOLN, 100mL OMNIPAQUE IOHEXOL 300 MG/ML SOLN  COMPARISON:  04/08/2012.  FINDINGS: Musculoskeletal:  Normal.  Lung Bases: Clear.  Liver: Tiny low-density lesion in the RIGHT hepatic lobe likely  represents a tiny cysts.  Spleen:  Normal.  Gallbladder:  Normal.  Common bile duct:  Normal.  Pancreas:  Normal.  Adrenal glands:  Normal bilaterally.  Kidneys: Normal enhancement. LEFT ureter appears normal. RIGHT ureter appears normal.  Stomach:  Normal.  Small bowel: Normal. No small bowel obstruction. No mesenteric adenopathy.  Colon: Normal appendix. Most of the colon is collapsed with mural thickening likely secondary to underdistention. No obstruction or inflammatory changes.  Pelvic Genitourinary:  Normal urinary bladder.  Peritoneum: Tiny amount of free fluid in the anatomic pelvis which is abnormal but nonspecific.  Vascular/lymphatic: Normal.  Body Wall: Normal.  IMPRESSION: 1. Tiny amount of nonspecific intra-abdominal free fluid in the anatomic pelvis. 2. Most of the colon is collapsed which probably accounts for mild mural thickening. Colitis is considered less likely.   Electronically Signed   By: Andreas NewportGeoffrey  Lamke M.D.   On: 06/09/2014 14:15   Dg Abd Acute W/chest  06/14/2014   CLINICAL DATA:  Cough. Nausea and vomiting. Mid abdominal pain beginning 2 days ago.  EXAM: DG ABDOMEN ACUTE W/ 1V CHEST  COMPARISON:  CT the abdomen and pelvis 06/09/2014.  FINDINGS: There is no evidence of dilated bowel loops or free intraperitoneal air. No radiopaque calculi or other significant radiographic abnormality is seen. Heart size and mediastinal contours are within normal limits. Both lungs are clear.  IMPRESSION: Negative abdominal radiographs.  No acute cardiopulmonary disease.   Electronically Signed   By: Marin Robertshristopher  Mattern M.D.   On: 06/14/2014 11:01     PERTINENT LAB RESULTS: CBC:  Recent Labs  06/17/14 0514  WBC 7.3  HGB 14.7  HCT 43.6  PLT 277   CMET CMP     Component Value Date/Time   NA 139 06/17/2014 0514   K 3.9 06/17/2014 0514   CL 101 06/17/2014 0514   CO2 31 06/17/2014 0514   GLUCOSE 99 06/17/2014 0514   BUN 8 06/17/2014 0514   CREATININE 0.91 06/17/2014 0514   CALCIUM  8.8* 06/17/2014 0514   PROT 6.3* 06/17/2014 0514   ALBUMIN 3.9 06/17/2014 0514   AST 50* 06/17/2014 0514   ALT 102* 06/17/2014 0514   ALKPHOS 50 06/17/2014 0514   BILITOT 0.6 06/17/2014 0514   GFRNONAA >60 06/17/2014 0514   GFRAA >60 06/17/2014 0514    GFR Estimated Creatinine Clearance: 113.6 mL/min (by C-G formula based on Cr of 0.91). No results for input(s): LIPASE, AMYLASE in the last 72 hours. No results for input(s): CKTOTAL, CKMB, CKMBINDEX, TROPONINI in the last 72 hours. Invalid input(s): POCBNP No results for input(s): DDIMER in the last 72 hours. No results for input(s): HGBA1C in the last 72 hours. No results for input(s): CHOL, HDL, LDLCALC, TRIG, CHOLHDL, LDLDIRECT in the last 72 hours. No results for input(s): TSH, T4TOTAL, T3FREE, THYROIDAB in the last 72 hours.  Invalid input(s): FREET3 No results for input(s): VITAMINB12, FOLATE, FERRITIN, TIBC, IRON, RETICCTPCT in the last 72 hours. Coags: No  results for input(s): INR in the last 72 hours.  Invalid input(s): PT Microbiology: No results found for this or any previous visit (from the past 240 hour(s)).   BRIEF HOSPITAL COURSE:  Recurrent epigastric pain: Admitted and provided with supportive measures. CT of the abdomen on 5/8 negative for acute abnormalities. GI consulted, underwent EGD on 5/16 which showed a small antral ulcer and atypical of moderately severe segment of distal esophagitis-possibly prerenal induced esophagitis or viral/eosinophilic esophagitis. Diet advanced, patient tolerated a regular diet for breakfast this morning. He is still having some epigastric pain however his belly soft, he is now requesting that he be discharged. Patient claims that his pain is currently controlled with oral narcotics. Patient will be discharged home at his own request, recommendations from gastroenterology to continue with PPI twice a day for 2 months. I have asked patient to ensure that he follows up with [facility]  gastroenterology in the next 1-2 weeks to follow-up EGD biopsy results  Active Problems:  Alcohol abuse: No signs of withdrawal, managed with Librium and Ativan. Last drink was almost 9 days back. He was counseled extensively.   Anxiety with depression: Stable continue with Xanax and trazodone. Denies any suicidal/homicidal ideation at present. He unfortunately was noncompliant with any of his psychiatric medications-and was not taking any of them. I have asked that he make an appointment with his psychiatrist at [facility]. Follow-up.  TODAY-DAY OF DISCHARGE:  Subjective:   Cristino Degroff today has no headache,no chest abdominal pain,no new weakness tingling or numbness, feels much better wants to go home today.   Objective:   Blood pressure 97/55, pulse 62, temperature 98.1 F (36.7 C), temperature source Oral, resp. rate 17, height  (1.753 m), weight 65.318 kg (144 lb), SpO2 100 %.  Intake/Output Summary (Last 24 hours) at 06/18/14 1238 Last data filed at 06/18/14 1000  Gross per 24 hour  Intake   1455 ml  Output      0 ml  Net   1455 ml   Filed Weights   06/14/14 1729 06/17/14 1205  Weight: 68.04 kg (150 lb) 65.318 kg (144 lb)    Exam Awake Alert, Oriented *3, No new F.N deficits, Normal affect .AT,PERRAL Supple Neck,No JVD, No cervical lymphadenopathy appriciated.  Symmetrical Chest wall movement, Good air movement bilaterally, CTAB RRR,No Gallops,Rubs or new Murmurs, No Parasternal Heave +ve B.Sounds, Abd Soft, Non tender, No organomegaly appriciated, No rebound -guarding or rigidity. No Cyanosis, Clubbing or edema, No new Rash or bruise  DISCHARGE CONDITION: Stable  DISPOSITION: Home  DISCHARGE INSTRUCTIONS:    Activity:  As tolerated   Diet recommendation: Regular Diet  Discharge Instructions    Call MD for:  persistant nausea and vomiting    Complete by:  As directed      Call MD for:  severe uncontrolled pain    Complete by:  As directed       Diet general    Complete by:  As directed      Increase activity slowly    Complete by:  As directed            Follow-up Information    Follow up with HAYES,JOHN C, MD. Schedule an appointment as soon as possible for a visit in 1 week.   Specialty:  Gastroenterology   Contact information:   [address] [place] [address] [phone]       Schedule an appointment as soon as possible for a visit in 1  week to follow up.   Contact information:   Primary MD at [facility] in [facility]      Follow up with [facility]   Specialty:  Behavioral Health   Why:  follow with your Psych MD at [facility] information:   [address] [place] [address] [phone]       Total Time spent on discharge equals 25 minutes  Signed: Jeoffrey Massed 06/18/2014 12:38 PM

## 2014-06-18 NOTE — Progress Notes (Signed)
Tolerated Breakfast well-still has pain-but controlled with medications-he is requesting discharge-he does not want to stay an additional day (offered by this MD) for further optimization. Will discharge him at this own request. See discharge summary for details.

## 2014-06-18 NOTE — Progress Notes (Signed)
Discharge instructions reviewed with pt and father at bedside. All questions answered. Prescriptions given to pt and reviewed over as well. Pt has no further questions.

## 2014-08-03 ENCOUNTER — Inpatient Hospital Stay (HOSPITAL_BASED_OUTPATIENT_CLINIC_OR_DEPARTMENT_OTHER)
Admission: EM | Admit: 2014-08-03 | Discharge: 2014-08-07 | DRG: 392 | Disposition: A | Discharge status: Home / Self Care | Payer: Medicaid Other | Attending: Internal Medicine | Admitting: Internal Medicine

## 2014-08-03 ENCOUNTER — Encounter (HOSPITAL_BASED_OUTPATIENT_CLINIC_OR_DEPARTMENT_OTHER): Payer: Self-pay | Admitting: *Deleted

## 2014-08-03 DIAGNOSIS — R1013 Epigastric pain: Secondary | ICD-10-CM | POA: Diagnosis present

## 2014-08-03 DIAGNOSIS — R112 Nausea with vomiting, unspecified: Principal | ICD-10-CM | POA: Diagnosis present

## 2014-08-03 DIAGNOSIS — F329 Major depressive disorder, single episode, unspecified: Secondary | ICD-10-CM | POA: Diagnosis present

## 2014-08-03 DIAGNOSIS — K259 Gastric ulcer, unspecified as acute or chronic, without hemorrhage or perforation: Secondary | ICD-10-CM | POA: Diagnosis present

## 2014-08-03 DIAGNOSIS — R197 Diarrhea, unspecified: Secondary | ICD-10-CM

## 2014-08-03 DIAGNOSIS — F1410 Cocaine abuse, uncomplicated: Secondary | ICD-10-CM | POA: Diagnosis present

## 2014-08-03 DIAGNOSIS — E860 Dehydration: Secondary | ICD-10-CM | POA: Diagnosis present

## 2014-08-03 DIAGNOSIS — G8929 Other chronic pain: Secondary | ICD-10-CM | POA: Diagnosis present

## 2014-08-03 DIAGNOSIS — R109 Unspecified abdominal pain: Secondary | ICD-10-CM

## 2014-08-03 DIAGNOSIS — B1920 Unspecified viral hepatitis C without hepatic coma: Secondary | ICD-10-CM | POA: Diagnosis present

## 2014-08-03 DIAGNOSIS — F32A Depression, unspecified: Secondary | ICD-10-CM | POA: Diagnosis present

## 2014-08-03 DIAGNOSIS — F1210 Cannabis abuse, uncomplicated: Secondary | ICD-10-CM | POA: Diagnosis present

## 2014-08-03 DIAGNOSIS — R1115 Cyclical vomiting syndrome unrelated to migraine: Secondary | ICD-10-CM

## 2014-08-03 DIAGNOSIS — Z716 Tobacco abuse counseling: Secondary | ICD-10-CM | POA: Diagnosis present

## 2014-08-03 DIAGNOSIS — F1910 Other psychoactive substance abuse, uncomplicated: Secondary | ICD-10-CM | POA: Diagnosis present

## 2014-08-03 DIAGNOSIS — F17210 Nicotine dependence, cigarettes, uncomplicated: Secondary | ICD-10-CM | POA: Diagnosis present

## 2014-08-03 DIAGNOSIS — K219 Gastro-esophageal reflux disease without esophagitis: Secondary | ICD-10-CM | POA: Diagnosis present

## 2014-08-03 DIAGNOSIS — Z888 Allergy status to other drugs, medicaments and biological substances status: Secondary | ICD-10-CM

## 2014-08-03 DIAGNOSIS — D72829 Elevated white blood cell count, unspecified: Secondary | ICD-10-CM | POA: Diagnosis present

## 2014-08-03 DIAGNOSIS — F411 Generalized anxiety disorder: Secondary | ICD-10-CM | POA: Diagnosis present

## 2014-08-03 DIAGNOSIS — F1020 Alcohol dependence, uncomplicated: Secondary | ICD-10-CM | POA: Diagnosis present

## 2014-08-03 LAB — CBC WITH DIFFERENTIAL/PLATELET
Basophils Absolute: 0 10*3/uL (ref 0.0–0.1)
Basophils Relative: 0 % (ref 0–1)
EOS ABS: 0 10*3/uL (ref 0.0–0.7)
Eosinophils Relative: 0 % (ref 0–5)
HEMATOCRIT: 44 % (ref 39.0–52.0)
HEMOGLOBIN: 15.9 g/dL (ref 13.0–17.0)
LYMPHS PCT: 9 % — AB (ref 12–46)
Lymphs Abs: 1.8 10*3/uL (ref 0.7–4.0)
MCH: 30.7 pg (ref 26.0–34.0)
MCHC: 36.1 g/dL — ABNORMAL HIGH (ref 30.0–36.0)
MCV: 84.9 fL (ref 78.0–100.0)
Monocytes Absolute: 0.9 10*3/uL (ref 0.1–1.0)
Monocytes Relative: 4 % (ref 3–12)
NEUTROS ABS: 17.7 10*3/uL — AB (ref 1.7–7.7)
Neutrophils Relative %: 87 % — ABNORMAL HIGH (ref 43–77)
Platelets: 340 10*3/uL (ref 150–400)
RBC: 5.18 MIL/uL (ref 4.22–5.81)
RDW: 12.5 % (ref 11.5–15.5)
WBC: 20.4 10*3/uL — ABNORMAL HIGH (ref 4.0–10.5)

## 2014-08-03 LAB — URINALYSIS, ROUTINE W REFLEX MICROSCOPIC
Bilirubin Urine: NEGATIVE
GLUCOSE, UA: NEGATIVE mg/dL
HGB URINE DIPSTICK: NEGATIVE
KETONES UR: 15 mg/dL — AB
LEUKOCYTES UA: NEGATIVE
Nitrite: NEGATIVE
PH: 8 (ref 5.0–8.0)
Protein, ur: NEGATIVE mg/dL
SPECIFIC GRAVITY, URINE: 1.02 (ref 1.005–1.030)
Urobilinogen, UA: 0.2 mg/dL (ref 0.0–1.0)

## 2014-08-03 LAB — COMPREHENSIVE METABOLIC PANEL
ALBUMIN: 4.9 g/dL (ref 3.5–5.0)
ALT: 150 U/L — AB (ref 17–63)
AST: 63 U/L — AB (ref 15–41)
Alkaline Phosphatase: 70 U/L (ref 38–126)
Anion gap: 12 (ref 5–15)
BUN: 11 mg/dL (ref 6–20)
CALCIUM: 10 mg/dL (ref 8.9–10.3)
CHLORIDE: 102 mmol/L (ref 101–111)
CO2: 24 mmol/L (ref 22–32)
CREATININE: 0.87 mg/dL (ref 0.61–1.24)
Glucose, Bld: 146 mg/dL — ABNORMAL HIGH (ref 65–99)
POTASSIUM: 3.5 mmol/L (ref 3.5–5.1)
Sodium: 138 mmol/L (ref 135–145)
Total Bilirubin: 0.7 mg/dL (ref 0.3–1.2)
Total Protein: 8.3 g/dL — ABNORMAL HIGH (ref 6.5–8.1)

## 2014-08-03 LAB — RAPID URINE DRUG SCREEN, HOSP PERFORMED
Amphetamines: NOT DETECTED
Barbiturates: NOT DETECTED
Benzodiazepines: POSITIVE — AB
COCAINE: POSITIVE — AB
Opiates: NOT DETECTED
Tetrahydrocannabinol: POSITIVE — AB

## 2014-08-03 LAB — LIPASE, BLOOD: LIPASE: 16 U/L — AB (ref 22–51)

## 2014-08-03 MED ORDER — ONDANSETRON HCL 4 MG/2ML IJ SOLN
4.0000 mg | Freq: Once | INTRAMUSCULAR | Status: AC
  Administered 2014-08-03: 4 mg via INTRAVENOUS
  Filled 2014-08-03: qty 2

## 2014-08-03 MED ORDER — SODIUM CHLORIDE 0.9 % IV SOLN
1000.0000 mL | Freq: Once | INTRAVENOUS | Status: AC
  Administered 2014-08-03: 1000 mL via INTRAVENOUS

## 2014-08-03 MED ORDER — HYDROMORPHONE HCL 1 MG/ML IJ SOLN
1.0000 mg | Freq: Once | INTRAMUSCULAR | Status: AC
  Administered 2014-08-03: 1 mg via INTRAVENOUS
  Filled 2014-08-03: qty 1

## 2014-08-03 MED ORDER — METOCLOPRAMIDE HCL 5 MG/ML IJ SOLN
10.0000 mg | Freq: Once | INTRAMUSCULAR | Status: AC
  Administered 2014-08-03: 10 mg via INTRAVENOUS
  Filled 2014-08-03: qty 2

## 2014-08-03 MED ORDER — SODIUM CHLORIDE 0.9 % IV SOLN
1000.0000 mL | INTRAVENOUS | Status: DC
  Administered 2014-08-03 – 2014-08-06 (×9): 1000 mL via INTRAVENOUS

## 2014-08-03 MED ORDER — LORAZEPAM 2 MG/ML IJ SOLN
1.0000 mg | Freq: Once | INTRAMUSCULAR | Status: AC
  Administered 2014-08-03: 1 mg via INTRAVENOUS
  Filled 2014-08-03: qty 1

## 2014-08-03 MED ORDER — DIPHENHYDRAMINE HCL 50 MG/ML IJ SOLN
25.0000 mg | Freq: Once | INTRAMUSCULAR | Status: AC
  Administered 2014-08-03: 25 mg via INTRAVENOUS
  Filled 2014-08-03: qty 1

## 2014-08-03 NOTE — ED Notes (Signed)
Pt reports onset of n/v/d and abdominal pain since last night. Pt reports history of ulcers, feels similar.

## 2014-08-03 NOTE — ED Provider Notes (Signed)
CSN: [id number]     Arrival date & time 08/03/14  1939 History   First MD Initiated Contact with Patient 08/03/14 2009     Chief Complaint  Patient presents with  . Abdominal Pain     (Consider location/radiation/quality/duration/timing/severity/associated sxs/prior Treatment) Patient is a 29 y.o. male presenting with abdominal pain. The history is provided by the patient. No language interpreter was used.  Abdominal Pain Pain location:  Generalized Pain quality: aching and stabbing   Pain radiates to:  Does not radiate Pain severity:  Moderate Timing:  Constant Progression:  Worsening Context: alcohol use   Relieved by:  Nothing Worsened by:  Nothing tried Ineffective treatments:  None tried Associated symptoms: vomiting   Associated symptoms: no diarrhea   Risk factors: alcohol abuse   Pt reports he has an ulcer.   Pt has a history of alcohol and drug abuse.   Pt had a recent upper gi by Dr. Madilyn FiremanHayes.   Pt reported to have 2 ulcers.  Pt has a history of chronic abdominal pain.  Pt admits to  marijuana use.  (remote history of Iv drug use) Hepatitis C.  Pt denies current alcohol use.     Past Medical History  Diagnosis Date  . Depression   . Chronic abdominal pain   . Anxiety   . Tourette disease   . Hepatitis C   . Alcohol dependence   . Opiate dependence    Past Surgical History  Procedure Laterality Date  . Esophagogastroduodenoscopy (egd) with propofol Left 06/17/2014    Procedure: ESOPHAGOGASTRODUODENOSCOPY (EGD) WITH PROPOFOL;  Surgeon: Dorena CookeyJohn Hayes, MD;  Location: [facility];  Service: Endoscopy;  Laterality: Left;   Family History  Problem Relation Age of Onset  . Ulcerative colitis Father    History  Substance Use Topics  . Smoking status: Current Every Day Smoker -- 1.00 packs/day    Types: Cigarettes  . Smokeless tobacco: Not on file  . Alcohol Use: 7.2 oz/week    12 Cans of beer per week     Comment: Daily     Review of Systems  Gastrointestinal:  Positive for vomiting and abdominal pain. Negative for diarrhea.  All other systems reviewed and are negative.     Allergies  Acetaminophen  Home Medications   Prior to Admission medications   Medication Sig Start Date End Date Taking? Authorizing Provider  ALPRAZolam Prudy Feeler(XANAX) 1 MG tablet Take 1 tablet (1 mg total) by mouth at bedtime as needed for anxiety. 06/18/14   Shanker Levora DredgeM Ghimire, MD  ondansetron (ZOFRAN) 4 MG tablet Take 1 tablet (4 mg total) by mouth every 6 (six) hours. 06/18/14   Shanker Levora DredgeM Ghimire, MD  oxyCODONE (OXY IR/ROXICODONE) 5 MG immediate release tablet Take 1 tablet (5 mg total) by mouth every 6 (six) hours as needed for moderate pain. 06/18/14   Shanker Levora DredgeM Ghimire, MD  pantoprazole (PROTONIX) 40 MG tablet Take 1 tablet (40 mg total) by mouth 2 (two) times daily. Switch for any other PPI at similar dose and frequency 06/18/14   Shanker Levora DredgeM Ghimire, MD  sucralfate (CARAFATE) 1 GM/10ML suspension Take 10 mLs (1 g total) by mouth 4 (four) times daily -  with meals and at bedtime. 06/18/14   Shanker Levora DredgeM Ghimire, MD  traZODone (DESYREL) 50 MG tablet Take 1 tablet (50 mg total) by mouth at bedtime. 06/18/14   Shanker Levora DredgeM Ghimire, MD   BP 122/85 mmHg  Pulse 65  Temp(Src) 97.3 F (36.3 C) (Oral)  Resp 20  Ht  (1.727 m)  Wt 160 lb (72.576 kg)  BMI 24.33 kg/m2  SpO2 99% Physical Exam  Constitutional: He is oriented to person, place, and time. He appears well-developed and well-nourished.  HENT:  Head: Normocephalic.  Right Ear: External ear normal.  Left Ear: External ear normal.  Nose: Nose normal.  Mouth/Throat: Oropharynx is clear and moist.  Eyes: Conjunctivae and EOM are normal. Pupils are equal, round, and reactive to light.  Neck: Normal range of motion.  Cardiovascular: Normal rate and intact distal pulses.   Pulmonary/Chest: Effort normal.  Abdominal: He exhibits no distension. There is tenderness.  Musculoskeletal: Normal range of motion.  Neurological: He is  alert and oriented to person, place, and time.  Skin: Skin is warm.  Psychiatric: He has a normal mood and affect.  Nursing note and vitals reviewed.   ED Course  Procedures (including critical care time) Labs Review Labs Reviewed  CBC WITH DIFFERENTIAL/PLATELET  COMPREHENSIVE METABOLIC PANEL  LIPASE, BLOOD  URINALYSIS, ROUTINE W REFLEX MICROSCOPIC (NOT AT [facility])  URINE RAPID DRUG SCREEN, HOSP PERFORMED    Imaging Review No results found.   EKG Interpretation None      MDM  Pt complains of continued pain.  Pt given second dosage of dilaudid.   Urine shows 15 ketones, UDs positive fro cocaine marijuana and benzodiazapines.  (Pt reports he is no longer drinking alcohol.  Pt feels like he needs to be in the hospital.    Final diagnoses:  Abdominal pain       Elson Areas, PA-C 08/04/14 0007  Gwyneth Sprout, MD 08/04/14 2153

## 2014-08-03 NOTE — ED Notes (Signed)
Patient tried to give urine sample, but unable to. Patient went back to restroom, dry heaving/vomiting.

## 2014-08-03 NOTE — ED Notes (Signed)
Called to room by patient for patient c/o "its the pain, the pain goes to nausea and the nausea goes to panic" - Notified Clydie BraunKaren, [place].

## 2014-08-04 ENCOUNTER — Inpatient Hospital Stay (HOSPITAL_COMMUNITY): Payer: Medicaid Other

## 2014-08-04 ENCOUNTER — Emergency Department (HOSPITAL_BASED_OUTPATIENT_CLINIC_OR_DEPARTMENT_OTHER): Payer: Medicaid Other

## 2014-08-04 ENCOUNTER — Encounter (HOSPITAL_COMMUNITY): Payer: Self-pay | Admitting: Radiology

## 2014-08-04 DIAGNOSIS — F17210 Nicotine dependence, cigarettes, uncomplicated: Secondary | ICD-10-CM | POA: Diagnosis present

## 2014-08-04 DIAGNOSIS — R1013 Epigastric pain: Secondary | ICD-10-CM | POA: Diagnosis present

## 2014-08-04 DIAGNOSIS — F329 Major depressive disorder, single episode, unspecified: Secondary | ICD-10-CM | POA: Diagnosis present

## 2014-08-04 DIAGNOSIS — F1210 Cannabis abuse, uncomplicated: Secondary | ICD-10-CM | POA: Diagnosis present

## 2014-08-04 DIAGNOSIS — G8929 Other chronic pain: Secondary | ICD-10-CM | POA: Diagnosis present

## 2014-08-04 DIAGNOSIS — R197 Diarrhea, unspecified: Secondary | ICD-10-CM

## 2014-08-04 DIAGNOSIS — E860 Dehydration: Secondary | ICD-10-CM | POA: Diagnosis present

## 2014-08-04 DIAGNOSIS — F1910 Other psychoactive substance abuse, uncomplicated: Secondary | ICD-10-CM

## 2014-08-04 DIAGNOSIS — Z888 Allergy status to other drugs, medicaments and biological substances status: Secondary | ICD-10-CM | POA: Diagnosis not present

## 2014-08-04 DIAGNOSIS — Z716 Tobacco abuse counseling: Secondary | ICD-10-CM | POA: Diagnosis present

## 2014-08-04 DIAGNOSIS — F32A Depression, unspecified: Secondary | ICD-10-CM | POA: Diagnosis present

## 2014-08-04 DIAGNOSIS — F411 Generalized anxiety disorder: Secondary | ICD-10-CM | POA: Diagnosis present

## 2014-08-04 DIAGNOSIS — B1920 Unspecified viral hepatitis C without hepatic coma: Secondary | ICD-10-CM | POA: Diagnosis present

## 2014-08-04 DIAGNOSIS — F1410 Cocaine abuse, uncomplicated: Secondary | ICD-10-CM | POA: Diagnosis present

## 2014-08-04 DIAGNOSIS — B182 Chronic viral hepatitis C: Secondary | ICD-10-CM | POA: Diagnosis not present

## 2014-08-04 DIAGNOSIS — D72829 Elevated white blood cell count, unspecified: Secondary | ICD-10-CM | POA: Diagnosis present

## 2014-08-04 DIAGNOSIS — R109 Unspecified abdominal pain: Secondary | ICD-10-CM | POA: Diagnosis present

## 2014-08-04 DIAGNOSIS — K259 Gastric ulcer, unspecified as acute or chronic, without hemorrhage or perforation: Secondary | ICD-10-CM | POA: Diagnosis present

## 2014-08-04 DIAGNOSIS — K219 Gastro-esophageal reflux disease without esophagitis: Secondary | ICD-10-CM | POA: Diagnosis present

## 2014-08-04 DIAGNOSIS — R112 Nausea with vomiting, unspecified: Secondary | ICD-10-CM | POA: Diagnosis present

## 2014-08-04 DIAGNOSIS — F1020 Alcohol dependence, uncomplicated: Secondary | ICD-10-CM | POA: Diagnosis present

## 2014-08-04 LAB — I-STAT CG4 LACTIC ACID, ED: LACTIC ACID, VENOUS: 0.72 mmol/L (ref 0.5–2.0)

## 2014-08-04 MED ORDER — METOCLOPRAMIDE HCL 5 MG/ML IJ SOLN: 10.0000 mg | Freq: Once | INTRAMUSCULAR | Status: DC

## 2014-08-04 MED ORDER — ONDANSETRON HCL 4 MG/2ML IJ SOLN
4.0000 mg | Freq: Once | INTRAMUSCULAR | Status: AC
  Administered 2014-08-04 (×2): 4 mg via INTRAVENOUS
  Filled 2014-08-04: qty 2

## 2014-08-04 MED ORDER — ONDANSETRON HCL 4 MG/2ML IJ SOLN
4.0000 mg | Freq: Three times a day (TID) | INTRAMUSCULAR | Status: DC | PRN
  Administered 2014-08-04 – 2014-08-05 (×3): 4 mg via INTRAVENOUS
  Filled 2014-08-04 (×3): qty 2

## 2014-08-04 MED ORDER — NICOTINE 21 MG/24HR TD PT24
21.0000 mg | MEDICATED_PATCH | Freq: Every day | TRANSDERMAL | Status: DC
  Administered 2014-08-04 – 2014-08-06 (×3): 21 mg via TRANSDERMAL
  Filled 2014-08-04 (×4): qty 1

## 2014-08-04 MED ORDER — GI COCKTAIL ~~LOC~~
30.0000 mL | Freq: Three times a day (TID) | ORAL | Status: DC | PRN
  Administered 2014-08-04 – 2014-08-06 (×5): 30 mL via ORAL
  Filled 2014-08-04 (×5): qty 30

## 2014-08-04 MED ORDER — PANTOPRAZOLE SODIUM 40 MG IV SOLR
40.0000 mg | Freq: Two times a day (BID) | INTRAVENOUS | Status: DC
  Administered 2014-08-04 – 2014-08-06 (×6): 40 mg via INTRAVENOUS
  Filled 2014-08-04 (×11): qty 40

## 2014-08-04 MED ORDER — TRAZODONE HCL 50 MG PO TABS
50.0000 mg | ORAL_TABLET | Freq: Every day | ORAL | Status: DC
  Administered 2014-08-04 – 2014-08-06 (×3): 50 mg via ORAL
  Filled 2014-08-04 (×5): qty 1

## 2014-08-04 MED ORDER — SUCRALFATE 1 GM/10ML PO SUSP
1.0000 g | Freq: Three times a day (TID) | ORAL | Status: DC
  Administered 2014-08-04 – 2014-08-07 (×13): 1 g via ORAL
  Filled 2014-08-04 (×18): qty 10

## 2014-08-04 MED ORDER — IOHEXOL 300 MG/ML  SOLN
25.0000 mL | INTRAMUSCULAR | Status: AC
  Administered 2014-08-04 (×2): 25 mL via ORAL

## 2014-08-04 MED ORDER — ALPRAZOLAM 0.5 MG PO TABS
1.0000 mg | ORAL_TABLET | Freq: Every evening | ORAL | Status: DC | PRN
  Administered 2014-08-05 – 2014-08-06 (×3): 1 mg via ORAL
  Filled 2014-08-04 (×3): qty 2

## 2014-08-04 MED ORDER — HYDROMORPHONE HCL 1 MG/ML IJ SOLN
1.0000 mg | INTRAMUSCULAR | Status: DC | PRN
  Administered 2014-08-04: 1 mg via INTRAVENOUS
  Filled 2014-08-04: qty 1

## 2014-08-04 MED ORDER — ONDANSETRON HCL 4 MG/2ML IJ SOLN: 4.0000 mg | Freq: Once | INTRAMUSCULAR | Status: AC

## 2014-08-04 MED ORDER — ENOXAPARIN SODIUM 40 MG/0.4ML ~~LOC~~ SOLN
40.0000 mg | SUBCUTANEOUS | Status: DC
  Administered 2014-08-04 – 2014-08-07 (×4): 40 mg via SUBCUTANEOUS
  Filled 2014-08-04 (×5): qty 0.4

## 2014-08-04 MED ORDER — HYDROMORPHONE HCL 1 MG/ML IJ SOLN
1.0000 mg | INTRAMUSCULAR | Status: DC | PRN
  Administered 2014-08-04 – 2014-08-06 (×11): 1 mg via INTRAVENOUS
  Filled 2014-08-04 (×12): qty 1

## 2014-08-04 MED ORDER — OXYCODONE HCL 5 MG PO TABS
5.0000 mg | ORAL_TABLET | Freq: Four times a day (QID) | ORAL | Status: DC | PRN
  Administered 2014-08-04 – 2014-08-07 (×8): 5 mg via ORAL
  Filled 2014-08-04 (×8): qty 1

## 2014-08-04 MED ORDER — ONDANSETRON HCL 4 MG/2ML IJ SOLN
INTRAMUSCULAR | Status: AC
  Administered 2014-08-04: 4 mg via INTRAVENOUS
  Filled 2014-08-04: qty 2

## 2014-08-04 MED ORDER — KETOROLAC TROMETHAMINE 30 MG/ML IJ SOLN
30.0000 mg | Freq: Once | INTRAMUSCULAR | Status: AC
  Administered 2014-08-04: 30 mg via INTRAVENOUS
  Filled 2014-08-04: qty 1

## 2014-08-04 MED ORDER — IOHEXOL 300 MG/ML  SOLN
100.0000 mL | Freq: Once | INTRAMUSCULAR | Status: AC | PRN
  Administered 2014-08-04: 100 mL via INTRAVENOUS

## 2014-08-04 MED ORDER — PANTOPRAZOLE SODIUM 40 MG IV SOLR
40.0000 mg | Freq: Once | INTRAVENOUS | Status: AC
  Administered 2014-08-04: 40 mg via INTRAVENOUS
  Filled 2014-08-04: qty 40

## 2014-08-04 NOTE — ED Notes (Signed)
Dr Clyde LundborgNiu states that patient may be med-surg bed not a tele bed.

## 2014-08-04 NOTE — Progress Notes (Signed)
Patient seen and examined. Admitted after midnight secondary to abd pain, nausea, vomiting and diarrhea. UDS positive for cocaine, marijuana and benzodiazepine. Had hx of antral ulcers and reported pain is similar in nature to abd pain when the ulcers were discovered. No hematemesis and no hematochezia or melena. Please referred to H&P written by Dr. Clyde LundborgNiu for further info/details on admission.  Plan: -PPI BID -carafate and PRN GI cocktail -PRN pain meds and antiemetics -will provide IVF's resuscitation -follow electrolytes, WBC's and Hgb trend -no signs of infection; continue holding on antibiotics   Jeff Browning, Jeff Browning [phone]

## 2014-08-04 NOTE — ED Notes (Signed)
Pt sleeping/ resting, aroused to alert, NAD, calm, interactive, intermittent vomiting, pain continues. IVF locked off for transport.

## 2014-08-04 NOTE — H&P (Signed)
[facility] History and Physical  Jeff Nationshomas T Millington ZOX:[id number] DOB: 12-28-1985 DOA: 08/03/2014  Referring physician: ED physician PCP: Pcp Not In System  Specialists:   Chief Complaint: nausea, vomiting, abdominal pain, diarrhea.  HPI: Jeff Browning is a 29 y.o. male with PMH of gastric ulcer, GERD, depression, anxiety, Monica abdominal pain, hepatitis C, alcohol abuse, who presents with the nausea, vomiting, abdominal pain, diarrhea.  Patient was recently hospitalized from  5/13-5/17 due to similar presentations. He had EGD on 5/16 which showed small antral ulcers. HIDA negative. He was discharged on Protonix and sucralfate. He has not been completely complaint to his medications, but state that he did not drink alcohol since he was found to have antral ulcers. In the past 2 days, he has been having nausea, vomiting, diarrhea and abdominal pain. His abdominal pain is located in epigastric area, constant, 10 out of 10 in severity, nonradiating. It is alleviated slightly by vomiting out. He reports that he has mild dysuria sometimes, but no burning or increased urinary frequency. He does not have chest pain, cough, shortness breath.  In ED, patient was found to have WBC 20.4, temperature 27.3, tachycardia, electrolytes okay, lactate 0.72, UDS positive for cocaine, THC and benzo, lipase 16, urinalysis negative. X-ray of acute chest/abdomen is negative for acute abnormalities.  Where does patient live?   At home    Can patient participate in ADLs?  Yes     Review of Systems:   General: no fevers, chills, no changes in body weight, has poor appetite, has fatigue HEENT: no blurry vision, hearing changes or sore throat Pulm: no dyspnea, coughing, wheezing CV: no chest pain, palpitations Abd: has nausea, vomiting, abdominal pain, diarrhea, no constipation GU: has dysuria, no burning on urination, increased urinary frequency, hematuria  Ext: no leg edema Neuro: no unilateral  weakness, numbness, or tingling, no vision change or hearing loss Skin: no rash MSK: No muscle spasm, no deformity, no limitation of range of movement in spin Heme: No easy bruising.  Travel history: No recent long distant travel.  Allergy:  Allergies  Allergen Reactions  . Acetaminophen     Hep C    Past Medical History  Diagnosis Date  . Depression   . Chronic abdominal pain   . Anxiety   . Tourette disease   . Hepatitis C   . Alcohol dependence   . Opiate dependence     Past Surgical History  Procedure Laterality Date  . Esophagogastroduodenoscopy (egd) with propofol Left 06/17/2014    Procedure: ESOPHAGOGASTRODUODENOSCOPY (EGD) WITH PROPOFOL;  Surgeon: Dorena CookeyJohn Hayes, MD;  Location: [facility];  Service: Endoscopy;  Laterality: Left;    Social History:  reports that he has been smoking Cigarettes.  He has been smoking about 1.00 pack per day. He does not have any smokeless tobacco history on file. He reports that he drinks about 7.2 oz of alcohol per week. He reports that he uses illicit drugs (Marijuana).  Family History:  Family History  Problem Relation Age of Onset  . Ulcerative colitis Father      Prior to Admission medications   Medication Sig Start Date End Date Taking? Authorizing Provider  ALPRAZolam Prudy Feeler(XANAX) 1 MG tablet Take 1 tablet (1 mg total) by mouth at bedtime as needed for anxiety. 06/18/14   Shanker Levora DredgeM Ghimire, MD  ondansetron (ZOFRAN) 4 MG tablet Take 1 tablet (4 mg total) by mouth every 6 (six) hours. 06/18/14   Shanker Levora DredgeM Ghimire, MD  oxyCODONE (OXY IR/ROXICODONE) 5  MG immediate release tablet Take 1 tablet (5 mg total) by mouth every 6 (six) hours as needed for moderate pain. 06/18/14   Shanker Levora Dredge, MD  pantoprazole (PROTONIX) 40 MG tablet Take 1 tablet (40 mg total) by mouth 2 (two) times daily. Switch for any other PPI at similar dose and frequency 06/18/14   Shanker Levora Dredge, MD  sucralfate (CARAFATE) 1 GM/10ML suspension Take 10 mLs (1 g total)  by mouth 4 (four) times daily -  with meals and at bedtime. 06/18/14   Shanker Levora Dredge, MD  traZODone (DESYREL) 50 MG tablet Take 1 tablet (50 mg total) by mouth at bedtime. 06/18/14   Shanker Levora Dredge, MD    Physical Exam: Filed Vitals:   08/04/14 0330 08/04/14 0400 08/04/14 0430 08/04/14 0500  BP: 115/49 123/73 117/73 104/55  Pulse: 53 66 58 57  Temp:      TempSrc:      Resp: Height:      Weight:      SpO2: 97% 96% 98% 98%   General: Not in acute distress, dry mucus and membrane HEENT:       Eyes: PERRL, EOMI, no scleral icterus.       ENT: No discharge from the ears and nose, no pharynx injection, no tonsillar enlargement.        Neck: No JVD, no bruit, no mass felt. Heme: No neck lymph node enlargement. Cardiac: S1/S2, RRR, No murmurs, No gallops or rubs. Pulm:  No rales, wheezing, rhonchi or rubs. Abd: Soft, nondistended, tenderness over epigastric area, no rebound pain, no organomegaly, BS present. Ext: No pitting leg edema bilaterally. 2+DP/PT pulse bilaterally. Musculoskeletal: No joint deformities, No joint redness or warmth, no limitation of ROM in spin. Skin: No rashes.  Neuro: Alert, oriented X3, cranial nerves II-XII grossly intact, muscle strength 5/5 in all extremities, sensation to light touch intact.  Psych: Patient is not psychotic, no suicidal or hemocidal ideation.  Labs on Admission:  Basic Metabolic Panel:  Recent Labs Lab 08/03/14 2056  NA 138  K 3.5  CL 102  CO2 24  GLUCOSE 146*  BUN 11  CREATININE 0.87  CALCIUM 10.0   Liver Function Tests:  Recent Labs Lab 08/03/14 2056  AST 63*  ALT 150*  ALKPHOS 70  BILITOT 0.7  PROT 8.3*  ALBUMIN 4.9    Recent Labs Lab 08/03/14 2056  LIPASE 16*   No results for input(s): AMMONIA in the last 168 hours. CBC:  Recent Labs Lab 08/03/14 2056  WBC 20.4*  NEUTROABS 17.7*  HGB 15.9  HCT 44.0  MCV 84.9  PLT 340   Cardiac Enzymes: No results for input(s): CKTOTAL, CKMB,  CKMBINDEX, TROPONINI in the last 168 hours.  BNP (last 3 results) No results for input(s): BNP in the last 8760 hours.  ProBNP (last 3 results) No results for input(s): PROBNP in the last 8760 hours.  CBG: No results for input(s): GLUCAP in the last 168 hours.  Radiological Exams on Admission: Dg Abd Acute W/chest  08/04/2014   CLINICAL DATA:  Nausea vomiting diarrhea and abdominal pain, onset last night.  EXAM: DG ABDOMEN ACUTE W/ 1V CHEST  COMPARISON:  None.  FINDINGS: 06/14/2014  IMPRESSION: Negative abdominal radiographs.  No acute cardiopulmonary disease.   Electronically Signed   By: Ellery Plunk M.D.   On: 08/04/2014 01:48    EKG:  Not done in ED, will get one.   Assessment/Plan Principal Problem:   Nausea  vomiting and diarrhea Active Problems:   Alcohol dependence   Hepatitis C   Generalized anxiety disorder   Chronic abdominal pain   Depression   Leukocytosis   Polysubstance abuse  Nausea vomiting, diarrhea and abdominal pain: Etiology is not clear. Lipase negative. differential diagnoses include gastric ulcer, viral gastroenteritis, C. difficile colitis, cocaine and marijuana abuse, gallstone and cholecystitis. -will admit to med surg bed -Symptomatic treatment: When necessary Zofran for nausea, Dilaudid for pain -IV Protonix -Carafate -C diff PCR and GI pathogen panel -ct-abd/pelvis with contrast  Leukocytosis: Etiology is not clear. UA is negative, no respiratory symptoms. May be related to stress-induced de-margination, but cannot rule out infectious etiology. Since patient is nonseptic and without fever, I will hold antibodies -follow up blood and urine culture -check GC/Chalamydia given dysuria  Tobacco abuse and Alcohol abuse:  -Did counseling about importance of quitting smoking -Nicotine patch -will not start CIWA since he did not drink alcohol for several weeks, no sign of withdraw now.  Polysubstance abuse: Including cocaine and marijuana  abuse -Counsult to social work  Depression and anxiety: no suicidal or homicidal ideations. -Continue home medications: Trazodone and When necessary Xanax   DVT ppx:  SQ Lovenox  Code Status: Full code Family Communication: None at bed side.   Disposition Plan: Admit to inpatient   Date of Service 08/04/2014    Lorretta Harp [facility] Pager [phone]  If 7PM-7AM, please contact night-coverage [email or web address] Password [email or web address] 08/04/2014, 6:54 AM

## 2014-08-05 DIAGNOSIS — B182 Chronic viral hepatitis C: Secondary | ICD-10-CM

## 2014-08-05 DIAGNOSIS — F329 Major depressive disorder, single episode, unspecified: Secondary | ICD-10-CM

## 2014-08-05 DIAGNOSIS — D72829 Elevated white blood cell count, unspecified: Secondary | ICD-10-CM

## 2014-08-05 DIAGNOSIS — F411 Generalized anxiety disorder: Secondary | ICD-10-CM

## 2014-08-05 LAB — CBC
HCT: 41.1 % (ref 39.0–52.0)
HEMOGLOBIN: 14 g/dL (ref 13.0–17.0)
MCH: 29.7 pg (ref 26.0–34.0)
MCHC: 34.1 g/dL (ref 30.0–36.0)
MCV: 87.3 fL (ref 78.0–100.0)
PLATELETS: 259 10*3/uL (ref 150–400)
RBC: 4.71 MIL/uL (ref 4.22–5.81)
RDW: 12.9 % (ref 11.5–15.5)
WBC: 8.7 10*3/uL (ref 4.0–10.5)

## 2014-08-05 LAB — COMPREHENSIVE METABOLIC PANEL
ALT: 98 U/L — ABNORMAL HIGH (ref 17–63)
ANION GAP: 7 (ref 5–15)
AST: 39 U/L (ref 15–41)
Albumin: 3.7 g/dL (ref 3.5–5.0)
Alkaline Phosphatase: 55 U/L (ref 38–126)
BUN: <5 mg/dL — ABNORMAL LOW (ref 6–20)
CHLORIDE: 105 mmol/L (ref 101–111)
CO2: 28 mmol/L (ref 22–32)
Calcium: 8.8 mg/dL — ABNORMAL LOW (ref 8.9–10.3)
Creatinine, Ser: 0.88 mg/dL (ref 0.61–1.24)
GFR calc Af Amer: >60 mL/min (ref >60)
GFR calc non Af Amer: >60 mL/min (ref >60)
GLUCOSE: 88 mg/dL (ref 65–99)
POTASSIUM: 3.9 mmol/L (ref 3.5–5.1)
Sodium: 140 mmol/L (ref 135–145)
Total Bilirubin: 0.6 mg/dL (ref 0.3–1.2)
Total Protein: 6.3 g/dL — ABNORMAL LOW (ref 6.5–8.1)

## 2014-08-05 LAB — GLUCOSE, CAPILLARY: Glucose-Capillary: 98 mg/dL (ref 65–99)

## 2014-08-05 LAB — HIV ANTIBODY (ROUTINE TESTING W REFLEX): HIV Screen 4th Generation wRfx: NONREACTIVE

## 2014-08-05 MED ORDER — VANCOMYCIN HCL IN DEXTROSE 1-5 GM/200ML-% IV SOLN
1000.0000 mg | Freq: Once | INTRAVENOUS | Status: AC
  Administered 2014-08-05: 1000 mg via INTRAVENOUS
  Filled 2014-08-05: qty 200

## 2014-08-05 MED ORDER — VANCOMYCIN HCL IN DEXTROSE 1-5 GM/200ML-% IV SOLN
1000.0000 mg | Freq: Three times a day (TID) | INTRAVENOUS | Status: DC
  Administered 2014-08-05 – 2014-08-07 (×7): 1000 mg via INTRAVENOUS
  Filled 2014-08-05 (×8): qty 200

## 2014-08-05 NOTE — Progress Notes (Signed)
[facility] PROGRESS NOTE  VERN GUERETTE ZOX:[id number] DOB: [birth year] DOA: 08/03/2014 PCP: Pcp Not In System  Assessment/Plan: 1-abd pain, nausea and vomiting: most likely secondary to GERD/peptic ulcers. Patient had hx of antral ulcers and reports he finish medication for acid protection and never follow with GI as an outpatient and was nott aking any more meds since he ran out of them -continue PPI -continue carafate and PRN GI cocktail -will arrange for GI follow up as an outpatient (Dr. Madilyn Fireman) -advise to quit smoking and to avoid NSAID's -patient tolerating CLD; will advance to full and further if tolerated; most likely home in am  2-positive blood cx's -1/2; potential staph infection -i anticipate contaminant; patient is non-toxic and do not have signs of active infection -will follow cx's results; ok to continue vanc for now  3-Hep C: to be follow by Dr. Madilyn Fireman as outpatient  4-leukocytosis: due to dehydration and demargination -resolved with IVF's -will monitor trend  5-depression and anxiety: -will continue trazodone and PRN xanax  6-tobacco, alcohol and polysubstance abuse -cessation counseling provided -patient is motivated and looking to quit  Code Status: Full Family Communication: no family at bedside Disposition Plan: remains inpatient for now   Consultants:  None   Procedures:  See below for x-ray reports   Antibiotics:  vanc 7/2  HPI/Subjective: Afebrile, no further diarrhea or vomiting. Patient denies CP or SOB. Still with abd pain and nausea, but managing to tolerate CLD. 1/2 blood cx's positive to potential staph aureus  Objective: Filed Vitals:   08/05/14 2221  BP: 92/54  Pulse: 54  Temp: 98.7 F (37.1 C)  Resp: 15    Intake/Output Summary (Last 24 hours) at 08/05/14 2325 Last data filed at 08/05/14 1421  Gross per 24 hour  Intake   1920 ml  Output    700 ml  Net   1220 ml   Filed Weights   08/03/14 1955  Weight:  72.576 kg (160 lb)    Exam:   General:  Afebrile, feeling better overall; denies CP or SOB. Patient still with epigastric discomfort and nausea; but tolerating CLD  Cardiovascular: no rubs, no gallops, no murmurs; RRR  Respiratory: good air movement, no wheezing, no crackles  Abdomen: soft, ND, positive BS; epigastric/mid abdomen tenderness, no guarding  Musculoskeletal: no edema or cyanosis  Data Reviewed: Basic Metabolic Panel:  Recent Labs Lab 08/03/14 2056 08/05/14 0513  NA 138 140  K 3.5 3.9  CL 102 105  CO2 24 28  GLUCOSE 146* 88  BUN 11 <5*  CREATININE 0.87 0.88  CALCIUM 10.0 8.8*   Liver Function Tests:  Recent Labs Lab 08/03/14 2056 08/05/14 0513  AST 63* 39  ALT 150* 98*  ALKPHOS 70 55  BILITOT 0.7 0.6  PROT 8.3* 6.3*  ALBUMIN 4.9 3.7    Recent Labs Lab 08/03/14 2056  LIPASE 16*   CBC:  Recent Labs Lab 08/03/14 2056 08/05/14 0513  WBC 20.4* 8.7  NEUTROABS 17.7*  --   HGB 15.9 14.0  HCT 44.0 41.1  MCV 84.9 87.3  PLT 340 259   CBG:  Recent Labs Lab 08/05/14 0819  GLUCAP 98    Recent Results (from the past 240 hour(s))  Culture, blood (routine x 2)     Status: None   Collection Time: 08/04/14  7:51 AM  Result Value Ref Range Status   Specimen Description BLOOD RIGHT HAND  Final   Special Requests BOTTLES DRAWN AEROBIC ONLY 5CC  Final  Culture  Setup Time   Final    GRAM POSITIVE COCCI IN CLUSTERS GRAM POSITIVE COCCI IN PAIRS AEROBIC BOTTLE ONLY CRITICAL RESULT CALLED TO, READ BACK BY AND VERIFIED WITH: Cala BradfordN WILEY,RN [phone] WILDERK    Culture   Final    GRAM POSITIVE COCCI IN CLUSTERS GRAM POSITIVE COCCI IN PAIRS AEROBIC BOTTLE ONLY    Report Status 08/05/2014 FINAL  Final  Culture, blood (routine x 2)     Status: None (Preliminary result)   Collection Time: 08/04/14  7:51 AM  Result Value Ref Range Status   Specimen Description BLOOD RIGHT ANTECUBITAL  Final   Special Requests BOTTLES DRAWN AEROBIC ONLY 5CC  Final    Culture NO GROWTH 1 DAY  Final   Report Status PENDING  Incomplete     Studies: Ct Abdomen Pelvis W Contrast  08/04/2014   CLINICAL DATA:  Nausea vomiting. Abdominal pain and diarrhea. History of hepatitis-C. Alcohol dependence.  EXAM: CT ABDOMEN AND PELVIS WITH CONTRAST  TECHNIQUE: Multidetector CT imaging of the abdomen and pelvis was performed using the standard protocol following bolus administration of intravenous contrast.  CONTRAST:  100mL OMNIPAQUE IOHEXOL 300 MG/ML  SOLN  COMPARISON:  Plain films of earlier today.  CT of 06/09/2014  FINDINGS: Lower chest: Clear lung bases. Normal heart size without pericardial or pleural effusion.  Hepatobiliary: Normal liver. Normal gallbladder, without biliary ductal dilatation.  Pancreas: Normal, without mass or ductal dilatation.  Spleen: Normal  Adrenals/Urinary Tract: Normal adrenal glands. Punctate left renal collecting system calculus versus early contrast excretion. Upper pole on coronal image 79. Normal urinary bladder.  Stomach/Bowel: Normal stomach, without wall thickening. The colon is again decompressed. Areas of apparent mild wall thickening are felt to be secondary. Normal terminal ileum and appendix. Normal small bowel.  Vascular/Lymphatic: Normal caliber of the aorta and branch vessels. No abdominopelvic adenopathy.  Reproductive: Normal prostate.  Other: Trace cul-de-sac fluid on image 72.  Gas and edema within the anterior abdominal wall, eccentric right.  Musculoskeletal: No acute osseous abnormality.  IMPRESSION: 1.  No acute process in the abdomen or pelvis. 2. Trace cul-de-sac fluid, as before.  Nonspecific. 3. Possible left nephrolithiasis. 4. Decompressed colon. Areas of apparent wall thickening are again felt to be secondary. Colitis felt unlikely. 5. Gas and edema in the anterior right abdominal wall may be related to recent injections.   Electronically Signed   By: Jeronimo GreavesKyle  Talbot M.D.   On: 08/04/2014 11:31   Dg Abd Acute  W/chest  08/04/2014   CLINICAL DATA:  Nausea vomiting diarrhea and abdominal pain, onset last night.  EXAM: DG ABDOMEN ACUTE W/ 1V CHEST  COMPARISON:  None.  FINDINGS: 06/14/2014  IMPRESSION: Negative abdominal radiographs.  No acute cardiopulmonary disease.   Electronically Signed   By: Ellery Plunkaniel R Mitchell M.D.   On: 08/04/2014 01:48    Scheduled Meds: . enoxaparin (LOVENOX) injection  40 mg Subcutaneous Q24H  . nicotine  21 mg Transdermal Daily  . pantoprazole (PROTONIX) IV  40 mg Intravenous Q12H  . sucralfate  1 g Oral TID WC & HS  . traZODone  50 mg Oral QHS  . vancomycin  1,000 mg Intravenous Q8H   Continuous Infusions: . sodium chloride 1,000 mL (08/05/14 1628)    Principal Problem:   Nausea vomiting and diarrhea Active Problems:   Alcohol dependence   Hepatitis C   Generalized anxiety disorder   Chronic abdominal pain   Depression   Leukocytosis   Polysubstance abuse  Time spent: 30 minutes    Vassie Loll  [facility] Pager [phone]. If 7PM-7AM, please contact night-coverage at [email or web address], password [email or web address] 08/05/2014, 11:25 PM  LOS: 1 day

## 2014-08-05 NOTE — Progress Notes (Signed)
ANTIBIOTIC CONSULT NOTE - INITIAL  Pharmacy Consult for vancomycin Indication: bacteremia  Allergies  Allergen Reactions  . Acetaminophen     Hep C    Patient Measurements: Height: 5\' 8"  (172.7 cm) Weight: 160 lb (72.576 kg) IBW/kg (Calculated) : 68.4  Vital Signs: Temp: 98.7 F (37.1 C) (07/03 2101) Temp Source: Oral (07/03 2101) BP: 106/48 mmHg (07/03 2101) Pulse Rate: 58 (07/03 2101)  Labs:  Recent Labs  08/03/14 2056  WBC 20.4*  HGB 15.9  PLT 340  CREATININE 0.87   Estimated Creatinine Clearance: 124.5 mL/min (by C-G formula based on Cr of 0.87).   Microbiology: Recent Results (from the past 720 hour(s))  Culture, blood (routine x 2)     Status: None   Collection Time: 08/04/14  7:51 AM  Result Value Ref Range Status   Specimen Description BLOOD RIGHT HAND  Final   Special Requests BOTTLES DRAWN AEROBIC ONLY 5CC  Final   Culture  Setup Time   Final    GRAM POSITIVE COCCI IN CLUSTERS GRAM POSITIVE COCCI IN PAIRS AEROBIC BOTTLE ONLY CRITICAL RESULT CALLED TO, READ BACK BY AND VERIFIED WITH: Cala BradfordN WILEY,RN [phone] WILDERK    Culture   Final    GRAM POSITIVE COCCI IN CLUSTERS GRAM POSITIVE COCCI IN PAIRS AEROBIC BOTTLE ONLY    Report Status 08/05/2014 FINAL  Final    Medical History: Past Medical History  Diagnosis Date  . Depression   . Chronic abdominal pain   . Anxiety   . Tourette disease   . Hepatitis C   . Alcohol dependence   . Opiate dependence     Medications:  Prescriptions prior to admission  Medication Sig Dispense Refill Last Dose  . ALPRAZolam (XANAX) 1 MG tablet Take 1 tablet (1 mg total) by mouth at bedtime as needed for anxiety. (Patient taking differently: Take 1 mg by mouth daily as needed for anxiety. ) 15 tablet 0 Past Week at Unknown time  . ondansetron (ZOFRAN) 4 MG tablet Take 1 tablet (4 mg total) by mouth every 6 (six) hours. 15 tablet 0 6 weeks  . pantoprazole (PROTONIX) 40 MG tablet Take 1 tablet (40 mg total) by mouth  2 (two) times daily. Switch for any other PPI at similar dose and frequency 120 tablet 0 6 weeks  . sucralfate (CARAFATE) 1 GM/10ML suspension Take 10 mLs (1 g total) by mouth 4 (four) times daily -  with meals and at bedtime. 420 mL 0 6 weeks   Scheduled:  . enoxaparin (LOVENOX) injection  40 mg Subcutaneous Q24H  . nicotine  21 mg Transdermal Daily  . pantoprazole (PROTONIX) IV  40 mg Intravenous Q12H  . sucralfate  1 g Oral TID WC & HS  . traZODone  50 mg Oral QHS   Infusions:  . sodium chloride 1,000 mL (08/05/14 0034)    Assessment: 28yo male c/o N/V/D and abdominal pain, pt reports sx feel similar to prior ulcers, found with leukocytosis but no signs of infection, now w/ GPC in pairs and clusters on blood cx, to begin IV ABX.  Goal of Therapy:  Vancomycin trough level 15-20 mcg/ml  Plan:  Will start vancomycin 1000mg  IV Q8H and monitor CBC, Cx, levels prn.  Vernard GamblesVeronda Mckenna Boruff, PharmD, BCPS  08/05/2014,1:52 AM

## 2014-08-05 NOTE — Progress Notes (Signed)
First set of blood cultures grew gram + cocci in clusters and pairs. Paged on-call physician. New orders received and implemented. Will continue to monitor.

## 2014-08-06 DIAGNOSIS — R7881 Bacteremia: Secondary | ICD-10-CM

## 2014-08-06 DIAGNOSIS — K279 Peptic ulcer, site unspecified, unspecified as acute or chronic, without hemorrhage or perforation: Secondary | ICD-10-CM

## 2014-08-06 LAB — GLUCOSE, CAPILLARY: Glucose-Capillary: 86 mg/dL (ref 65–99)

## 2014-08-06 NOTE — Progress Notes (Signed)
[facility] PROGRESS NOTE  Jeff Browning ZOX:[id number] DOB: April 14, 1985 DOA: 08/03/2014 PCP: Pcp Not In System  Assessment/Plan: 1-abd pain, nausea and vomiting: most likely secondary to GERD/peptic ulcers. Patient had hx of antral ulcers and reports he finish medication for acid protection and never follow with GI as an outpatient and was nott aking any more meds since he ran out of them. -continue PPI, carafate and PRN GI cocktail -will need GI follow up as an outpatient with [facility] (Dr. Madilyn Fireman); unable to get in touch with Dr. Madilyn Fireman today. Please contact him or his office prior to discharge patient to set up follow-up appointment -advise to quit smoking and to avoid NSAID's -patient tolerating full liquid diet; will advance to soft and maximize pain medications by mouth   2-positive blood cx's -1/2; potential staph infection -I anticipate contaminant; patient is non-toxic and do not have signs of active infection -will follow cx's results; ok to continue vanc for now  3-Hep C: to be follow by Dr. Madilyn Fireman as outpatient  4-leukocytosis: due to dehydration and demargination -resolved with IVF's -will monitor trend  5-depression and anxiety: -will continue trazodone and PRN xanax  6-tobacco, alcohol and polysubstance abuse -cessation counseling provided -patient is motivated and looking to quit  Code Status: Full Family Communication: no family at bedside Disposition Plan: remains inpatient for now, waiting at this moment final results of blood culture to prove contaminant versus true infection. I have call the labs and results will be available tomorrow morning.    Consultants:  None   Procedures:  See below for x-ray reports   Antibiotics:  vanc 7/2  HPI/Subjective: Afebrile, no further diarrhea or vomiting. 1/2 blood cx's positive to potential staph aureus. Still with abdominal pain.  Objective: Filed Vitals:   08/06/14 1328  BP: 107/66  Pulse: 58   Temp: 98.3 F (36.8 C)  Resp: 20    Intake/Output Summary (Last 24 hours) at 08/06/14 1749 Last data filed at 08/06/14 0658  Gross per 24 hour  Intake   1000 ml  Output   2600 ml  Net  -1600 ml   Filed Weights   08/03/14 1955  Weight: 72.576 kg (160 lb)    Exam:   General:  Afebrile, feeling better overall; denies CP or SOB. Patient still with epigastric discomfort and nausea; but tolerating full liquid diet and without any further episodes of vomiting or diarrhea  Cardiovascular: no rubs, no gallops, no murmurs; RRR  Respiratory: good air movement, no wheezing, no crackles  Abdomen: soft, ND, positive BS; epigastric/mid abdomen tenderness, no guarding  Musculoskeletal: no edema or cyanosis  Data Reviewed: Basic Metabolic Panel:  Recent Labs Lab 08/03/14 2056 08/05/14 0513  NA 138 140  K 3.5 3.9  CL 102 105  CO2 24 28  GLUCOSE 146* 88  BUN 11 <5*  CREATININE 0.87 0.88  CALCIUM 10.0 8.8*   Liver Function Tests:  Recent Labs Lab 08/03/14 2056 08/05/14 0513  AST 63* 39  ALT 150* 98*  ALKPHOS 70 55  BILITOT 0.7 0.6  PROT 8.3* 6.3*  ALBUMIN 4.9 3.7    Recent Labs Lab 08/03/14 2056  LIPASE 16*   CBC:  Recent Labs Lab 08/03/14 2056 08/05/14 0513  WBC 20.4* 8.7  NEUTROABS 17.7*  --   HGB 15.9 14.0  HCT 44.0 41.1  MCV 84.9 87.3  PLT 340 259   CBG:  Recent Labs Lab 08/05/14 0819 08/06/14 0746  GLUCAP 98 86    Recent Results (  from the past 240 hour(s))  Culture, blood (routine x 2)     Status: None (Preliminary result)   Collection Time: 08/04/14  7:51 AM  Result Value Ref Range Status   Specimen Description BLOOD RIGHT HAND  Final   Special Requests BOTTLES DRAWN AEROBIC ONLY 5CC  Final   Culture  Setup Time   Final    GRAM POSITIVE COCCI IN CLUSTERS GRAM POSITIVE COCCI IN PAIRS AEROBIC BOTTLE ONLY CRITICAL RESULT CALLED TO, READ BACK BY AND VERIFIED WITH: Cala BradfordN WILEY,RN [phone] WILDERK    Culture   Final    GRAM POSITIVE  COCCI IN CLUSTERS GRAM POSITIVE COCCI IN PAIRS AEROBIC BOTTLE ONLY    Report Status PENDING  Incomplete  Culture, blood (routine x 2)     Status: None (Preliminary result)   Collection Time: 08/04/14  7:51 AM  Result Value Ref Range Status   Specimen Description BLOOD RIGHT ANTECUBITAL  Final   Special Requests BOTTLES DRAWN AEROBIC ONLY 5CC  Final   Culture NO GROWTH 2 DAYS  Final   Report Status PENDING  Incomplete     Studies: No results found.  Scheduled Meds: . enoxaparin (LOVENOX) injection  40 mg Subcutaneous Q24H  . nicotine  21 mg Transdermal Daily  . pantoprazole (PROTONIX) IV  40 mg Intravenous Q12H  . sucralfate  1 g Oral TID WC & HS  . traZODone  50 mg Oral QHS  . vancomycin  1,000 mg Intravenous Q8H   Continuous Infusions: . sodium chloride 1,000 mL (08/06/14 0831)    Principal Problem:   Nausea vomiting and diarrhea Active Problems:   Alcohol dependence   Hepatitis C   Generalized anxiety disorder   Chronic abdominal pain   Depression   Leukocytosis   Polysubstance abuse    Time spent: 30 minutes    Vassie Browning, Jeff Baine  [facility] Pager [phone]. If 7PM-7AM, please contact night-coverage at [email or web address], password [email or web address] 08/06/2014, 5:49 PM  LOS: 2 days

## 2014-08-06 NOTE — Care Management Note (Signed)
Case Management Note  Patient Details  Name: Nolon Nationshomas T Wigington MRN: [id number] Date of Birth: 1985/03/08  Subjective/Objective:       Patient lives with spouse, on full liquids ,will try to advance diet.  NCM will cont to follow for dc needs.             Action/Plan:   Expected Discharge Date:                  Expected Discharge Plan:  Home/Self Care  In-House Referral:     Discharge planning Services  CM Consult  Post Acute Care Choice:    Choice offered to:     DME Arranged:    DME Agency:     HH Arranged:    HH Agency:     Status of Service:  In process, will continue to follow  Medicare Important Message Given:    Date Medicare IM Given:    Medicare IM give by:    Date Additional Medicare IM Given:    Additional Medicare Important Message give by:     If discussed at Long Length of Stay Meetings, dates discussed:    Additional Comments:  Leone Havenaylor, Temesgen Weightman [place], RN 08/06/2014, 3:39 PM

## 2014-08-07 DIAGNOSIS — R112 Nausea with vomiting, unspecified: Principal | ICD-10-CM

## 2014-08-07 DIAGNOSIS — R197 Diarrhea, unspecified: Secondary | ICD-10-CM

## 2014-08-07 LAB — CBC
HCT: 39 % (ref 39.0–52.0)
HEMOGLOBIN: 13.9 g/dL (ref 13.0–17.0)
MCH: 30.3 pg (ref 26.0–34.0)
MCHC: 35.6 g/dL (ref 30.0–36.0)
MCV: 85 fL (ref 78.0–100.0)
Platelets: 270 10*3/uL (ref 150–400)
RBC: 4.59 MIL/uL (ref 4.22–5.81)
RDW: 12.2 % (ref 11.5–15.5)
WBC: 7.6 10*3/uL (ref 4.0–10.5)

## 2014-08-07 LAB — BASIC METABOLIC PANEL
Anion gap: 4 — ABNORMAL LOW (ref 5–15)
CHLORIDE: 105 mmol/L (ref 101–111)
CO2: 32 mmol/L (ref 22–32)
CREATININE: 0.91 mg/dL (ref 0.61–1.24)
Calcium: 8.6 mg/dL — ABNORMAL LOW (ref 8.9–10.3)
GFR calc Af Amer: >60 mL/min (ref >60)
GFR calc non Af Amer: >60 mL/min (ref >60)
Glucose, Bld: 93 mg/dL (ref 65–99)
POTASSIUM: 3.8 mmol/L (ref 3.5–5.1)
Sodium: 141 mmol/L (ref 135–145)

## 2014-08-07 LAB — GLUCOSE, CAPILLARY: Glucose-Capillary: 110 mg/dL — ABNORMAL HIGH (ref 65–99)

## 2014-08-07 LAB — CULTURE, BLOOD (ROUTINE X 2)

## 2014-08-07 MED ORDER — TRAZODONE HCL 50 MG PO TABS: 50.0000 mg | ORAL_TABLET | Freq: Every day | ORAL | Status: DC

## 2014-08-07 MED ORDER — GI COCKTAIL ~~LOC~~: 30.0000 mL | Freq: Three times a day (TID) | ORAL | Status: AC | PRN

## 2014-08-07 MED ORDER — ONDANSETRON HCL 4 MG PO TABS: 4.0000 mg | ORAL_TABLET | Freq: Four times a day (QID) | ORAL | Status: AC

## 2014-08-07 MED ORDER — TRAZODONE HCL 50 MG PO TABS: 50.0000 mg | ORAL_TABLET | Freq: Every day | ORAL | Status: AC

## 2014-08-07 MED ORDER — SUCRALFATE 1 GM/10ML PO SUSP: 1.0000 g | Freq: Three times a day (TID) | ORAL | Status: AC

## 2014-08-07 MED ORDER — PANTOPRAZOLE SODIUM 40 MG PO TBEC: 40.0000 mg | DELAYED_RELEASE_TABLET | Freq: Two times a day (BID) | ORAL | Status: AC

## 2014-08-07 MED ORDER — PANTOPRAZOLE SODIUM 40 MG PO TBEC
40.0000 mg | DELAYED_RELEASE_TABLET | Freq: Two times a day (BID) | ORAL | Status: DC
  Filled 2014-08-07: qty 1

## 2014-08-07 MED ORDER — NICOTINE 21 MG/24HR TD PT24: 21.0000 mg | MEDICATED_PATCH | Freq: Every day | TRANSDERMAL | Status: AC

## 2014-08-07 NOTE — Progress Notes (Signed)
Patient complaining of abdominal pain. Oyxcodone  IR PO given, pt states feels as though "hunger pain." Patient provided with snacks. Will continue to monitor.

## 2014-08-07 NOTE — Progress Notes (Signed)
Patient without any nausea/vomiting overnight.

## 2014-08-07 NOTE — Discharge Planning (Signed)
Jeff Browning discharged Home per MD order.  Discharge instructions reviewed and discussed with the patient, all questions and concerns answered. Copy of instructions, care notes for new medications and scripts given to patient.    Medication List    TAKE these medications        ALPRAZolam 1 MG tablet  Commonly known as:  XANAX  Take 1 tablet (1 mg total) by mouth at bedtime as needed for anxiety.     gi cocktail Susp suspension  Take 30 mLs by mouth 3 (three) times daily as needed for indigestion. Shake well.     nicotine 21 mg/24hr patch  Commonly known as:  NICODERM CQ - dosed in mg/24 hours  Place 1 patch (21 mg total) onto the skin daily.     ondansetron 4 MG tablet  Commonly known as:  ZOFRAN  Take 1 tablet (4 mg total) by mouth every 6 (six) hours.     pantoprazole 40 MG tablet  Commonly known as:  PROTONIX  Take 1 tablet (40 mg total) by mouth 2 (two) times daily. Switch for any other PPI at similar dose and frequency     sucralfate 1 GM/10ML suspension  Commonly known as:  CARAFATE  Take 10 mLs (1 g total) by mouth 4 (four) times daily -  with meals and at bedtime.     traZODone 50 MG tablet  Commonly known as:  DESYREL  Take 1 tablet (50 mg total) by mouth at bedtime.        Patients skin is clean, dry and intact, no evidence of skin break down. IV site discontinued and catheter remains intact. Site without signs and symptoms of complications. Dressing and pressure applied.  Patient escorted to door by NT ambulating,  no distress noted upon discharge.  Pt. Decline substance abuse resources provided by SW.  [name], [name] 08/07/2014 11:48 AM

## 2014-08-07 NOTE — Discharge Summary (Signed)
Physician Discharge Summary  Jeff Browning MRN: [id number] DOB/AGE: [birth year] 29 y.o.  PCP: Pcp Not In System   Admit date: 08/03/2014 Discharge date: 08/07/2014  Discharge Diagnoses:     Principal Problem:   Nausea vomiting and diarrhea Active Problems:   Alcohol dependence   Hepatitis C   Generalized anxiety disorder   Chronic abdominal pain   Depression   Leukocytosis   Polysubstance abuse  cyclical nausea vomiting likely secondary to marijuana use Narcotic seeking behavior    Follow-up recommendations Follow-up with PCP in 3-5 days , including although additional recommended appointments as below Follow-up CBC, CMP in 3-5 days      Medication List    TAKE these medications        ALPRAZolam 1 MG tablet  Commonly known as:  XANAX  Take 1 tablet (1 mg total) by mouth at bedtime as needed for anxiety.     gi cocktail Susp suspension  Take 30 mLs by mouth 3 (three) times daily as needed for indigestion. Shake well.     nicotine 21 mg/24hr patch  Commonly known as:  NICODERM CQ - dosed in mg/24 hours  Place 1 patch (21 mg total) onto the skin daily.     ondansetron 4 MG tablet  Commonly known as:  ZOFRAN  Take 1 tablet (4 mg total) by mouth every 6 (six) hours.     pantoprazole 40 MG tablet  Commonly known as:  PROTONIX  Take 1 tablet (40 mg total) by mouth 2 (two) times daily. Switch for any other PPI at similar dose and frequency     sucralfate 1 GM/10ML suspension  Commonly known as:  CARAFATE  Take 10 mLs (1 g total) by mouth 4 (four) times daily -  with meals and at bedtime.     traZODone 50 MG tablet  Commonly known as:  DESYREL  Take 1 tablet (50 mg total) by mouth at bedtime.         Discharge Condition: Stable   Disposition: 01-Home or Self Care   Consults:  None     Significant Diagnostic Studies:  Ct Abdomen Pelvis W Contrast  08/04/2014   CLINICAL DATA:  Nausea vomiting. Abdominal pain and diarrhea. History of  hepatitis-C. Alcohol dependence.  EXAM: CT ABDOMEN AND PELVIS WITH CONTRAST  TECHNIQUE: Multidetector CT imaging of the abdomen and pelvis was performed using the standard protocol following bolus administration of intravenous contrast.  CONTRAST:  171m OMNIPAQUE IOHEXOL 300 MG/ML  SOLN  COMPARISON:  Plain films of earlier today.  CT of 06/09/2014  FINDINGS: Lower chest: Clear lung bases. Normal heart size without pericardial or pleural effusion.  Hepatobiliary: Normal liver. Normal gallbladder, without biliary ductal dilatation.  Pancreas: Normal, without mass or ductal dilatation.  Spleen: Normal  Adrenals/Urinary Tract: Normal adrenal glands. Punctate left renal collecting system calculus versus early contrast excretion. Upper pole on coronal image 79. Normal urinary bladder.  Stomach/Bowel: Normal stomach, without wall thickening. The colon is again decompressed. Areas of apparent mild wall thickening are felt to be secondary. Normal terminal ileum and appendix. Normal small bowel.  Vascular/Lymphatic: Normal caliber of the aorta and branch vessels. No abdominopelvic adenopathy.  Reproductive: Normal prostate.  Other: Trace cul-de-sac fluid on image 72.  Gas and edema within the anterior abdominal wall, eccentric right.  Musculoskeletal: No acute osseous abnormality.  IMPRESSION: 1.  No acute process in the abdomen or pelvis. 2. Trace cul-de-sac fluid, as before.  Nonspecific. 3. Possible left nephrolithiasis. 4. Decompressed colon.  Areas of apparent wall thickening are again felt to be secondary. Colitis felt unlikely. 5. Gas and edema in the anterior right abdominal wall may be related to recent injections.   Electronically Signed   By: Abigail Miyamoto M.D.   On: 08/04/2014 11:31   Dg Abd Acute W/chest  08/04/2014   CLINICAL DATA:  Nausea vomiting diarrhea and abdominal pain, onset last night.  EXAM: DG ABDOMEN ACUTE W/ 1V CHEST  COMPARISON:  None.  FINDINGS: 06/14/2014  IMPRESSION: Negative abdominal  radiographs.  No acute cardiopulmonary disease.   Electronically Signed   By: Andreas Newport M.D.   On: 08/04/2014 01:48        Filed Weights   08/03/14 1955  Weight: 72.576 kg (160 lb)     Microbiology: Recent Results (from the past 240 hour(s))  Culture, blood (routine x 2)     Status: None   Collection Time: 08/04/14  7:51 AM  Result Value Ref Range Status   Specimen Description BLOOD RIGHT HAND  Final   Special Requests BOTTLES DRAWN AEROBIC ONLY 5CC  Final   Culture  Setup Time   Final    GRAM POSITIVE COCCI IN CLUSTERS GRAM POSITIVE COCCI IN PAIRS AEROBIC BOTTLE ONLY CRITICAL RESULT CALLED TO, READ BACK BY AND VERIFIED WITH: Laroy Apple [id number] [place]    Culture   Final    STAPHYLOCOCCUS SPECIES (COAGULASE NEGATIVE) THE SIGNIFICANCE OF ISOLATING THIS ORGANISM FROM A SINGLE SET OF BLOOD CULTURES WHEN MULTIPLE SETS ARE DRAWN IS UNCERTAIN. PLEASE NOTIFY THE MICROBIOLOGY DEPARTMENT WITHIN ONE WEEK IF SPECIATION AND SENSITIVITIES ARE REQUIRED.    Report Status 08/07/2014 FINAL  Final  Culture, blood (routine x 2)     Status: None (Preliminary result)   Collection Time: 08/04/14  7:51 AM  Result Value Ref Range Status   Specimen Description BLOOD RIGHT ANTECUBITAL  Final   Special Requests BOTTLES DRAWN AEROBIC ONLY 5CC  Final   Culture NO GROWTH 2 DAYS  Final   Report Status PENDING  Incomplete       Blood Culture    Component Value Date/Time   SDES BLOOD RIGHT HAND 08/04/2014 0751   SDES BLOOD RIGHT ANTECUBITAL 08/04/2014 0751   SPECREQUEST BOTTLES DRAWN AEROBIC ONLY 5CC 08/04/2014 0751   SPECREQUEST BOTTLES DRAWN AEROBIC ONLY 5CC 08/04/2014 0751   CULT  08/04/2014 0751    STAPHYLOCOCCUS SPECIES (COAGULASE NEGATIVE) THE SIGNIFICANCE OF ISOLATING THIS ORGANISM FROM A SINGLE SET OF BLOOD CULTURES WHEN MULTIPLE SETS ARE DRAWN IS UNCERTAIN. PLEASE NOTIFY THE MICROBIOLOGY DEPARTMENT WITHIN ONE WEEK IF SPECIATION AND SENSITIVITIES ARE REQUIRED.    CULT NO GROWTH  2 DAYS 08/04/2014 0751   REPTSTATUS 08/07/2014 FINAL 08/04/2014 0751   REPTSTATUS PENDING 08/04/2014 0751      Labs: Results for orders placed or performed during the hospital encounter of 08/03/14 (from the past 48 hour(s))  Glucose, capillary     Status: None   Collection Time: 08/06/14  7:46 AM  Result Value Ref Range   Glucose-Capillary 86 65 - 99 mg/dL  CBC     Status: None   Collection Time: 08/07/14  5:29 AM  Result Value Ref Range   WBC 7.6 4.0 - 10.5 K/uL   RBC 4.59 4.22 - 5.81 MIL/uL   Hemoglobin 13.9 13.0 - 17.0 g/dL   HCT 39.0 39.0 - 52.0 %   MCV 85.0 78.0 - 100.0 fL   MCH 30.3 26.0 - 34.0 pg   MCHC 35.6 30.0 - 36.0 g/dL  RDW 12.2 11.5 - 15.5 %   Platelets 270 150 - 400 K/uL  Basic metabolic panel     Status: Abnormal   Collection Time: 08/07/14  5:29 AM  Result Value Ref Range   Sodium 141 135 - 145 mmol/L   Potassium 3.8 3.5 - 5.1 mmol/L   Chloride 105 101 - 111 mmol/L   CO2 32 22 - 32 mmol/L   Glucose, Bld 93 65 - 99 mg/dL   BUN <5 (L) 6 - 20 mg/dL   Creatinine, Ser 0.91 0.61 - 1.24 mg/dL   Calcium 8.6 (L) 8.9 - 10.3 mg/dL   GFR calc non Af Amer >60 >60 mL/min   GFR calc Af Amer >60 >60 mL/min    Comment: (NOTE) The eGFR has been calculated using the CKD EPI equation. This calculation has not been validated in all clinical situations. eGFR's persistently <60 mL/min signify possible Chronic Kidney Disease.    Anion gap 4 (L) 5 - 15  Glucose, capillary     Status: Abnormal   Collection Time: 08/07/14  7:56 AM  Result Value Ref Range   Glucose-Capillary 110 (H) 65 - 99 mg/dL   Drugs of Abuse     Component Value Date/Time   LABOPIA NONE DETECTED 08/03/2014 2326   COCAINSCRNUR POSITIVE* 08/03/2014 2326   LABBENZ POSITIVE* 08/03/2014 2326   AMPHETMU NONE DETECTED 08/03/2014 2326   THCU POSITIVE* 08/03/2014 2326   LABBARB NONE DETECTED 08/03/2014 2326      Lipid Panel  No results found for: CHOL, TRIG, HDL, CHOLHDL, VLDL, LDLCALC,  LDLDIRECT   No results found for: HGBA1C   Lab Results  Component Value Date   CREATININE 0.91 08/07/2014     Jeff Browning is a 29 y.o. male with PMH of gastric ulcer, GERD, depression, anxiety, Monica abdominal pain, hepatitis C, alcohol abuse, who presents with the nausea, vomiting, abdominal pain, diarrhea.  Patient was recently hospitalized from 5/13-5/17 due to similar presentations. He had EGD on 5/16 which showed small antral ulcers. HIDA negative. He was discharged on Protonix and sucralfate. He has not been completely complaint to his medications, but state that he did not drink alcohol since he was found to have antral ulcers. In the past 2 days, he has been having nausea, vomiting, diarrhea and abdominal pain. His abdominal pain is located in epigastric area, constant, 10 out of 10 in severity, nonradiating. It is alleviated slightly by vomiting out. He reports that he has mild dysuria sometimes, but no burning or increased urinary frequency. He does not have chest pain, cough, shortness breath.  In ED, patient was found to have WBC 20.4, temperature 27.3, tachycardia, electrolytes okay, lactate 0.72, UDS positive for cocaine, THC and benzo, lipase 16, urinalysis negative. X-ray of acute chest/abdomen is negative for acute abnormalities   HOSPITAL COURSE:   1-abd pain, nausea and vomiting: Could be cyclical vomiting secondary to marijuana use, urine drug screen positive for cocaine and marijuana, vs  GERD/peptic ulcers. Patient had hx of antral ulcers and reports he finish medication for acid protection and never follow with GI as an outpatient and was nott aking any more meds since he ran out of them. Patient is to continue with PPI, carafate and PRN GI cocktail, prescriptions provided -will need GI follow up as an outpatient with [facility] (Dr. Amedeo Plenty); we will try to set up this appointment prior to discharge -advise to quit smoking and to avoid NSAID's Tolerating diet,  no further episodes of nausea  vomiting overnight   2-positive blood cx's -1/2; potential staph infection, this was coag negative staph and likely a contaminant Vancomycin discontinued prior to discharge   3-Hep C: to be follow by Dr. Amedeo Plenty as outpatient  4-leukocytosis: due to dehydration and demargination Resolved  5-depression and anxiety: -will continue trazodone and PRN xanax, provided 30 tablets of trazodone,  6-tobacco, alcohol and polysubstance abuse -cessation counseling provided -patient is motivated and looking to quit Provided nicotine patch, patient requested refills on his Xanax which were denied as the patient has a primary care provider in [facility]    Discharge Exam   Blood pressure 111/65, pulse 52, temperature 98.2 F (36.8 C), temperature source Oral, resp. rate 18, height '5\' 8"'  (1.727 m), weight 72.576 kg (160 lb), SpO2 100 %.    General: Afebrile, feeling better overall; denies CP or SOB. Patient still with epigastric discomfort and nausea; but tolerating full liquid diet and without any further episodes of vomiting or diarrhea  Cardiovascular: no rubs, no gallops, no murmurs; RRR  Respiratory: good air movement, no wheezing, no crackles  Abdomen: soft, ND, positive BS; epigastric/mid abdomen tenderness, no guarding  Musculoskeletal: no edema or cyanosis      Discharge Instructions    Diet - low sodium heart healthy    Complete by:  As directed      Increase activity slowly    Complete by:  As directed            Follow-up Information    Follow up with PCP in [facility]. Schedule an appointment as soon as possible for a visit in 3 days.      Follow up with HAYES,JOHN C, MD. Schedule an appointment as soon as possible for a visit in 1 week.   Specialty:  Gastroenterology   Contact information:   [address] [place] [address] [phone]       Signed: Reyne Dumas 08/07/2014, 10:18 AM        Time  spent >45 mins

## 2014-08-07 NOTE — Clinical Social Work Note (Signed)
CSW attempted to assess patient for substance use. Patient currently showering. CSW will provide RN with SA resources in case patient wants these.   [name] MSW, [place] Island[place]CSWA, [place], [phone]

## 2014-08-08 NOTE — Progress Notes (Signed)
Utilization Review completed. Annalynn Centanni RN BSN CM 

## 2014-08-09 LAB — CULTURE, BLOOD (ROUTINE X 2): CULTURE: NO GROWTH

## 2014-08-10 ENCOUNTER — Emergency Department (HOSPITAL_COMMUNITY): Payer: Medicaid Other

## 2014-08-10 ENCOUNTER — Emergency Department (HOSPITAL_COMMUNITY)
Admission: EM | Admit: 2014-08-10 | Discharge: 2014-08-10 | Disposition: A | Discharge status: Home / Self Care | Payer: Medicaid Other | Attending: Emergency Medicine | Admitting: Emergency Medicine

## 2014-08-10 ENCOUNTER — Encounter (HOSPITAL_COMMUNITY): Payer: Self-pay

## 2014-08-10 DIAGNOSIS — R112 Nausea with vomiting, unspecified: Secondary | ICD-10-CM | POA: Insufficient documentation

## 2014-08-10 DIAGNOSIS — G8929 Other chronic pain: Secondary | ICD-10-CM | POA: Diagnosis not present

## 2014-08-10 DIAGNOSIS — F329 Major depressive disorder, single episode, unspecified: Secondary | ICD-10-CM | POA: Diagnosis not present

## 2014-08-10 DIAGNOSIS — F419 Anxiety disorder, unspecified: Secondary | ICD-10-CM | POA: Diagnosis not present

## 2014-08-10 DIAGNOSIS — R1084 Generalized abdominal pain: Secondary | ICD-10-CM | POA: Diagnosis not present

## 2014-08-10 DIAGNOSIS — Z79899 Other long term (current) drug therapy: Secondary | ICD-10-CM | POA: Insufficient documentation

## 2014-08-10 DIAGNOSIS — Z8711 Personal history of peptic ulcer disease: Secondary | ICD-10-CM | POA: Diagnosis not present

## 2014-08-10 DIAGNOSIS — Z8619 Personal history of other infectious and parasitic diseases: Secondary | ICD-10-CM | POA: Insufficient documentation

## 2014-08-10 DIAGNOSIS — R109 Unspecified abdominal pain: Secondary | ICD-10-CM | POA: Diagnosis present

## 2014-08-10 DIAGNOSIS — Z720 Tobacco use: Secondary | ICD-10-CM | POA: Diagnosis not present

## 2014-08-10 DIAGNOSIS — R1110 Vomiting, unspecified: Secondary | ICD-10-CM

## 2014-08-10 LAB — CBC WITH DIFFERENTIAL/PLATELET
BASOS ABS: 0 10*3/uL (ref 0.0–0.1)
Basophils Relative: 0 % (ref 0–1)
Eosinophils Absolute: 0 10*3/uL (ref 0.0–0.7)
Eosinophils Relative: 0 % (ref 0–5)
HCT: 43.3 % (ref 39.0–52.0)
Hemoglobin: 15.5 g/dL (ref 13.0–17.0)
Lymphocytes Relative: 9 % — ABNORMAL LOW (ref 12–46)
Lymphs Abs: 1.5 10*3/uL (ref 0.7–4.0)
MCH: 30.8 pg (ref 26.0–34.0)
MCHC: 35.8 g/dL (ref 30.0–36.0)
MCV: 85.9 fL (ref 78.0–100.0)
Monocytes Absolute: 0.6 10*3/uL (ref 0.1–1.0)
Monocytes Relative: 4 % (ref 3–12)
NEUTROS ABS: 14.7 10*3/uL — AB (ref 1.7–7.7)
Neutrophils Relative %: 87 % — ABNORMAL HIGH (ref 43–77)
PLATELETS: 370 10*3/uL (ref 150–400)
RBC: 5.04 MIL/uL (ref 4.22–5.81)
RDW: 12.6 % (ref 11.5–15.5)
WBC: 16.9 10*3/uL — ABNORMAL HIGH (ref 4.0–10.5)

## 2014-08-10 LAB — COMPREHENSIVE METABOLIC PANEL
ALBUMIN: 4.6 g/dL (ref 3.5–5.0)
ALK PHOS: 60 U/L (ref 38–126)
ALT: 109 U/L — ABNORMAL HIGH (ref 17–63)
ANION GAP: 11 (ref 5–15)
AST: 48 U/L — AB (ref 15–41)
BUN: 7 mg/dL (ref 6–20)
CO2: 24 mmol/L (ref 22–32)
Calcium: 9.8 mg/dL (ref 8.9–10.3)
Chloride: 105 mmol/L (ref 101–111)
Creatinine, Ser: 0.68 mg/dL (ref 0.61–1.24)
GFR calc Af Amer: >60 mL/min (ref >60)
GFR calc non Af Amer: >60 mL/min (ref >60)
Glucose, Bld: 114 mg/dL — ABNORMAL HIGH (ref 65–99)
Potassium: 3.7 mmol/L (ref 3.5–5.1)
Sodium: 140 mmol/L (ref 135–145)
TOTAL PROTEIN: 7.6 g/dL (ref 6.5–8.1)
Total Bilirubin: 0.7 mg/dL (ref 0.3–1.2)

## 2014-08-10 LAB — LIPASE, BLOOD: Lipase: 14 U/L — ABNORMAL LOW (ref 22–51)

## 2014-08-10 MED ORDER — METOCLOPRAMIDE HCL 5 MG/ML IJ SOLN
10.0000 mg | Freq: Once | INTRAMUSCULAR | Status: AC
  Administered 2014-08-10: 10 mg via INTRAVENOUS
  Filled 2014-08-10: qty 2

## 2014-08-10 MED ORDER — GI COCKTAIL ~~LOC~~
30.0000 mL | Freq: Once | ORAL | Status: AC
  Administered 2014-08-10: 30 mL via ORAL
  Filled 2014-08-10: qty 30

## 2014-08-10 MED ORDER — DIPHENHYDRAMINE HCL 50 MG/ML IJ SOLN
25.0000 mg | Freq: Once | INTRAMUSCULAR | Status: AC
  Administered 2014-08-10: 25 mg via INTRAVENOUS
  Filled 2014-08-10: qty 1

## 2014-08-10 MED ORDER — PROMETHAZINE HCL 25 MG/ML IJ SOLN
25.0000 mg | Freq: Once | INTRAMUSCULAR | Status: AC
  Administered 2014-08-10: 25 mg via INTRAMUSCULAR
  Filled 2014-08-10: qty 1

## 2014-08-10 MED ORDER — FENTANYL CITRATE (PF) 100 MCG/2ML IJ SOLN
50.0000 ug | Freq: Once | INTRAMUSCULAR | Status: AC
  Administered 2014-08-10: 50 ug via INTRAVENOUS
  Filled 2014-08-10: qty 2

## 2014-08-10 MED ORDER — LORAZEPAM 1 MG PO TABS
1.0000 mg | ORAL_TABLET | Freq: Once | ORAL | Status: AC
  Administered 2014-08-10: 1 mg via ORAL
  Filled 2014-08-10: qty 1

## 2014-08-10 MED ORDER — PANTOPRAZOLE SODIUM 40 MG IV SOLR
40.0000 mg | Freq: Once | INTRAVENOUS | Status: AC
  Administered 2014-08-10: 40 mg via INTRAVENOUS
  Filled 2014-08-10: qty 40

## 2014-08-10 MED ORDER — PROMETHAZINE HCL 25 MG RE SUPP: 25.0000 mg | Freq: Four times a day (QID) | RECTAL | Status: AC | PRN

## 2014-08-10 MED ORDER — SODIUM CHLORIDE 0.9 % IV BOLUS (SEPSIS)
1000.0000 mL | Freq: Once | INTRAVENOUS | Status: AC
  Administered 2014-08-10: 1000 mL via INTRAVENOUS

## 2014-08-10 NOTE — ED Notes (Signed)
Pt is still nauseated but reports that his pain level has finally decreased to 6/10.  He does state that the pain has moved into his groin area now, first on the right side and now more towards the middle.

## 2014-08-10 NOTE — ED Notes (Signed)
Per report.  Pt was awoken at 0530 with epigastric pain d/t ulcer dx in last few months.  EMS reports seeing him stick finger down throat to vomit because it better to vomit.  He reported vomiting 10 + times today.  EMS vitals: 136/92,58,100%RA

## 2014-08-10 NOTE — ED Notes (Signed)
Bed: [id number] Expected date:  Expected time:  Means of arrival:  Comments: EMS-Abd pain

## 2014-08-10 NOTE — Discharge Instructions (Signed)
Read the information below.  Use the prescribed medication as directed.  Please discuss all new medications with your pharmacist.  You may return to the Emergency Department at any time for worsening condition or any new symptoms that concern you.   If you develop high fevers, change in your chronic abdominal pain, uncontrolled vomiting, or are unable to tolerate fluids by mouth, return to the ER for a recheck.   Please follow up with your primary care provider and GI doctor for further treatment of your symptoms.   Abdominal Pain Many things can cause abdominal pain. Usually, abdominal pain is not caused by a disease and will improve without treatment. It can often be observed and treated at home. Your health care provider will do a physical exam and possibly order blood tests and X-rays to help determine the seriousness of your pain. However, in many cases, more time must pass before a clear cause of the pain can be found. Before that point, your health care provider may not know if you need more testing or further treatment. HOME CARE INSTRUCTIONS  Monitor your abdominal pain for any changes. The following actions may help to alleviate any discomfort you are experiencing:  Only take over-the-counter or prescription medicines as directed by your health care provider.  Do not take laxatives unless directed to do so by your health care provider.  Try a clear liquid diet (broth, tea, or water) as directed by your health care provider. Slowly move to a bland diet as tolerated. SEEK MEDICAL CARE IF:  You have unexplained abdominal pain.  You have abdominal pain associated with nausea or diarrhea.  You have pain when you urinate or have a bowel movement.  You experience abdominal pain that wakes you in the night.  You have abdominal pain that is worsened or improved by eating food.  You have abdominal pain that is worsened with eating fatty foods.  You have a fever. SEEK IMMEDIATE MEDICAL  CARE IF:   Your pain does not go away within 2 hours.  You keep throwing up (vomiting).  Your pain is felt only in portions of the abdomen, such as the right side or the left lower portion of the abdomen.  You pass bloody or black tarry stools. MAKE SURE YOU:  Understand these instructions.   Will watch your condition.   Will get help right away if you are not doing well or get worse.  Document Released: 10/28/2004 Document Revised: 01/23/2013 Document Reviewed: 09/27/2012 [facility]ExitCare Patient Information 2015 [place]. This information is not intended to replace advice given to you by your health care provider. Make sure you discuss any questions you have with your health care provider.  Nausea and Vomiting Nausea is a sick feeling that often comes before throwing up (vomiting). Vomiting is a reflex where stomach contents come out of your mouth. Vomiting can cause severe loss of body fluids (dehydration). Children and elderly adults can become dehydrated quickly, especially if they also have diarrhea. Nausea and vomiting are symptoms of a condition or disease. It is important to find the cause of your symptoms. CAUSES   Direct irritation of the stomach lining. This irritation can result from increased acid production (gastroesophageal reflux disease), infection, food poisoning, taking certain medicines (such as nonsteroidal anti-inflammatory drugs), alcohol use, or tobacco use.  Signals from the brain.These signals could be caused by a headache, heat exposure, an inner ear disturbance, increased pressure in the brain from injury, infection, a tumor, or a concussion,  pain, emotional stimulus, or metabolic problems.  An obstruction in the gastrointestinal tract (bowel obstruction).  Illnesses such as diabetes, hepatitis, gallbladder problems, appendicitis, kidney problems, cancer, sepsis, atypical symptoms of a heart attack, or eating disorders.  Medical treatments such as  chemotherapy and radiation.  Receiving medicine that makes you sleep (general anesthetic) during surgery. DIAGNOSIS Your caregiver may ask for tests to be done if the problems do not improve after a few days. Tests may also be done if symptoms are severe or if the reason for the nausea and vomiting is not clear. Tests may include:  Urine tests.  Blood tests.  Stool tests.  Cultures (to look for evidence of infection).  X-rays or other imaging studies. Test results can help your caregiver make decisions about treatment or the need for additional tests. TREATMENT You need to stay well hydrated. Drink frequently but in small amounts.You may wish to drink water, sports drinks, clear broth, or eat frozen ice pops or gelatin dessert to help stay hydrated.When you eat, eating slowly may help prevent nausea.There are also some antinausea medicines that may help prevent nausea. HOME CARE INSTRUCTIONS   Take all medicine as directed by your caregiver.  If you do not have an appetite, do not force yourself to eat. However, you must continue to drink fluids.  If you have an appetite, eat a normal diet unless your caregiver tells you differently.  Eat a variety of complex carbohydrates (rice, wheat, potatoes, bread), lean meats, yogurt, fruits, and vegetables.  Avoid high-fat foods because they are more difficult to digest.  Drink enough water and fluids to keep your urine clear or pale yellow.  If you are dehydrated, ask your caregiver for specific rehydration instructions. Signs of dehydration may include:  Severe thirst.  Dry lips and mouth.  Dizziness.  Dark urine.  Decreasing urine frequency and amount.  Confusion.  Rapid breathing or pulse. SEEK IMMEDIATE MEDICAL CARE IF:   You have blood or brown flecks (like coffee grounds) in your vomit.  You have black or bloody stools.  You have a severe headache or stiff neck.  You are confused.  You have severe abdominal  pain.  You have chest pain or trouble breathing.  You do not urinate at least once every 8 hours.  You develop cold or clammy skin.  You continue to vomit for longer than 24 to 48 hours.  You have a fever. MAKE SURE YOU:   Understand these instructions.  Will watch your condition.  Will get help right away if you are not doing well or get worse. Document Released: 01/18/2005 Document Revised: 04/12/2011 Document Reviewed: 06/17/2010 [facility] Patient Information 2015 [place], [place]. This information is not intended to replace advice given to you by your health care provider. Make sure you discuss any questions you have with your health care provider.

## 2014-08-10 NOTE — ED Notes (Signed)
Pt's sister called this charge nurse [facility]  Sister wanted to let staff know that pt is a heroin addict who often uses the emergency department as a way to get narcotic pain medication when he runs out of heroin.  [facility] has [facility] a [facility] but sister states that he is now trying to infiltrate the [facility].  EMS stated that pt was sticking his fingers down his throat during transport and was also shaking his arms, attempting to mimic seizure behavior, while still maintaining his A&O x 4 status.

## 2014-08-10 NOTE — ED Notes (Signed)
Blood draw completed at time of IV insertion.

## 2014-08-10 NOTE — ED Notes (Signed)
Pt is complaining of continued "miserable, unbearable" pain and is upset that he has not been given any medication to help him sleep.  Will notify provider.

## 2014-08-10 NOTE — ED Provider Notes (Signed)
CSN: [id number]     Arrival date & time 08/10/14  1455 History   First MD Initiated Contact with Patient 08/10/14 1503     Chief Complaint  Patient presents with  . Abdominal Pain     (Consider location/radiation/quality/duration/timing/severity/associated sxs/prior Treatment) The history is provided by the patient, a relative and medical records.     Pt with hx chronic abdominal pain, Hep C, heroin abuse, alcohol abuse, PUD with recent hospitalization for abdominal pain, N/V/D p/w uncontrolled epigastric pain with N/V.  Emesis is yellow, nonbloody.  He is also having diarrhea, chills, and had a panic attack earlier.  States he also had a panic attack earlier.  Denies fevers.  Denies having used any "recreational narcotics in the past two years" and denies that it was in his system during his recent hospitalizations.   Has appt with GI next week.  No hx abdominal surgeries.    Past Medical History  Diagnosis Date  . Depression   . Chronic abdominal pain   . Anxiety   . Tourette disease   . Hepatitis C   . Alcohol dependence   . Opiate dependence    Past Surgical History  Procedure Laterality Date  . Esophagogastroduodenoscopy (egd) with propofol Left 06/17/2014    Procedure: ESOPHAGOGASTRODUODENOSCOPY (EGD) WITH PROPOFOL;  Surgeon: Dorena CookeyJohn Hayes, MD;  Location: [facility];  Service: Endoscopy;  Laterality: Left;   Family History  Problem Relation Age of Onset  . Ulcerative colitis Father    History  Substance Use Topics  . Smoking status: Current Every Day Smoker -- 1.00 packs/day    Types: Cigarettes  . Smokeless tobacco: Not on file  . Alcohol Use: No     Comment: has stopped drinking x 2-3 months    Review of Systems  All other systems reviewed and are negative.     Allergies  Acetaminophen  Home Medications   Prior to Admission medications   Medication Sig Start Date End Date Taking? Authorizing Provider  ALPRAZolam Prudy Feeler(XANAX) 1 MG tablet Take 1 tablet (1 mg  total) by mouth at bedtime as needed for anxiety. Patient taking differently: Take 1 mg by mouth daily as needed for anxiety.  06/18/14  Yes Shanker Levora DredgeM Ghimire, MD  alum & mag hydroxide-simeth (MAALOX PLUS) 400-400-40 MG/5ML suspension Take 30 mLs by mouth 3 (three) times daily as needed for indigestion.    Yes Historical Provider, MD  ondansetron (ZOFRAN) 4 MG tablet Take 1 tablet (4 mg total) by mouth every 6 (six) hours. 08/07/14  Yes Richarda OverlieNayana Abrol, MD  pantoprazole (PROTONIX) 40 MG tablet Take 1 tablet (40 mg total) by mouth 2 (two) times daily. Switch for any other PPI at similar dose and frequency 08/07/14  Yes Richarda OverlieNayana Abrol, MD  sucralfate (CARAFATE) 1 GM/10ML suspension Take 10 mLs (1 g total) by mouth 4 (four) times daily -  with meals and at bedtime. 08/07/14  Yes Richarda OverlieNayana Abrol, MD  traZODone (DESYREL) 50 MG tablet Take 1 tablet (50 mg total) by mouth at bedtime. 08/07/14  Yes Richarda OverlieNayana Abrol, MD  Alum & Mag Hydroxide-Simeth (GI COCKTAIL) SUSP suspension Take 30 mLs by mouth 3 (three) times daily as needed for indigestion. Shake well. Patient not taking: Reported on 08/10/2014 08/07/14   Richarda OverlieNayana Abrol, MD  nicotine (NICODERM CQ - DOSED IN MG/24 HOURS) 21 mg/24hr patch Place 1 patch (21 mg total) onto the skin daily. Patient not taking: Reported on 08/10/2014 08/07/14   Richarda OverlieNayana Abrol, MD   BP 112/57 mmHg  Pulse 58  Temp(Src) 98.5 F (36.9 C) (Oral)  Resp 16  SpO2 100% Physical Exam  Constitutional: He appears well-developed and well-nourished. No distress.  HENT:  Head: Normocephalic and atraumatic.  Neck: Neck supple.  Cardiovascular: Normal rate and regular rhythm.   Pulmonary/Chest: Effort normal and breath sounds normal. No respiratory distress. He has no wheezes. He has no rales.  Abdominal: Soft. He exhibits no distension. There is generalized tenderness. There is no rebound and no guarding.  Diffuse tenderness with even light palpation  Neurological: He is alert. He exhibits normal muscle tone.   Skin: He is not diaphoretic.  Nursing note and vitals reviewed.   ED Course  Procedures (including critical care time) Labs Review Labs Reviewed  COMPREHENSIVE METABOLIC PANEL - Abnormal; Notable for the following:    Glucose, Bld 114 (*)    AST 48 (*)    ALT 109 (*)    All other components within normal limits  LIPASE, BLOOD - Abnormal; Notable for the following:    Lipase 14 (*)    All other components within normal limits  CBC WITH DIFFERENTIAL/PLATELET - Abnormal; Notable for the following:    WBC 16.9 (*)    Neutrophils Relative % 87 (*)    Neutro Abs 14.7 (*)    Lymphocytes Relative 9 (*)    All other components within normal limits    Imaging Review Dg Abd Acute W/chest  08/10/2014   CLINICAL DATA:  29 year old male with diffuse abdominal and pelvic pain with nausea and vomiting.  EXAM: DG ABDOMEN ACUTE W/ 1V CHEST  COMPARISON:  08/04/2014 and prior studies  FINDINGS: The cardiomediastinal silhouette is unremarkable.  The lungs are clear.  There is no evidence of airspace disease, pleural effusion or pneumothorax.  The bowel gas pattern is unremarkable.  There is no evidence of bowel obstruction or pneumoperitoneum.  No suspicious calcifications are identified.  No acute bony abnormalities are present.  IMPRESSION: Negative abdominal radiographs.  No acute cardiopulmonary disease.   Electronically Signed   By: Harmon Pier M.D.   On: 08/10/2014 17:00     EKG Interpretation None       6:32 PM Reexamination of patient's abdomen is unchanged, benign.  Soft, diffuse tenderness, no guarding, no rebound.   7:50 PM Discussed pt and plan with Dr Gwendolyn Grant.   9:24 PM Reexamination of abdomen unchanged.  Soft, diffusely tender, no guarding, no rebound.    MDM   Final diagnoses:  Chronic abdominal pain  Intractable vomiting with nausea, vomiting of unspecified type    Afebrile, nontoxic patient with chronic abdominal pain, heroin addiction, known PUD, recent admission for  N/V/D abdominal pain.  Labs remarkable only for leukocytosis.  Xray negative.  Serial abdominal exam over many hours in the ED remained unchanged.  Tenderness remained diffuse.  Nonsurgical abdomen.  F/U next week scheduled with PCP and GI. D/C home with rectal phenergan.  Discussed result, findings, treatment, and follow up  with patient.  Pt given return precautions.  Pt verbalizes understanding and agrees with plan.        I doubt any other EMC precluding discharge at this time including, but not necessarily limited to the following: appendicitis, cholecystitis    Trixie Dredge, PA-C 08/11/14 0102  Elwin Mocha, MD 08/11/14 2057

## 2014-10-09 ENCOUNTER — Emergency Department (HOSPITAL_BASED_OUTPATIENT_CLINIC_OR_DEPARTMENT_OTHER): Payer: Medicaid Other

## 2014-10-09 ENCOUNTER — Encounter (HOSPITAL_BASED_OUTPATIENT_CLINIC_OR_DEPARTMENT_OTHER): Payer: Self-pay

## 2014-10-09 ENCOUNTER — Emergency Department (HOSPITAL_BASED_OUTPATIENT_CLINIC_OR_DEPARTMENT_OTHER)
Admission: EM | Admit: 2014-10-09 | Discharge: 2014-10-09 | Disposition: A | Discharge status: Home / Self Care | Payer: Medicaid Other | Attending: Emergency Medicine | Admitting: Emergency Medicine

## 2014-10-09 DIAGNOSIS — M25532 Pain in left wrist: Secondary | ICD-10-CM | POA: Diagnosis present

## 2014-10-09 DIAGNOSIS — Z720 Tobacco use: Secondary | ICD-10-CM | POA: Diagnosis not present

## 2014-10-09 DIAGNOSIS — G8929 Other chronic pain: Secondary | ICD-10-CM | POA: Diagnosis not present

## 2014-10-09 DIAGNOSIS — R200 Anesthesia of skin: Secondary | ICD-10-CM | POA: Diagnosis not present

## 2014-10-09 DIAGNOSIS — Z79899 Other long term (current) drug therapy: Secondary | ICD-10-CM | POA: Diagnosis not present

## 2014-10-09 DIAGNOSIS — F419 Anxiety disorder, unspecified: Secondary | ICD-10-CM | POA: Insufficient documentation

## 2014-10-09 DIAGNOSIS — Z8619 Personal history of other infectious and parasitic diseases: Secondary | ICD-10-CM | POA: Diagnosis not present

## 2014-10-09 DIAGNOSIS — F329 Major depressive disorder, single episode, unspecified: Secondary | ICD-10-CM | POA: Diagnosis not present

## 2014-10-09 MED ORDER — NAPROXEN 500 MG PO TABS: 500.0000 mg | ORAL_TABLET | Freq: Two times a day (BID) | ORAL | Status: AC

## 2014-10-09 NOTE — ED Notes (Addendum)
Pt woke up Thursday night with wrist pain, unsure of cause. C/O shooting pain with movement.

## 2014-10-09 NOTE — ED Provider Notes (Signed)
CSN: [id number]     Arrival date & time 10/09/14  4098 History   First MD Initiated Contact with Patient 10/09/14 [phone]     Chief Complaint  Patient presents with  . Wrist Pain     (Consider location/radiation/quality/duration/timing/severity/associated sxs/prior Treatment) Patient is a 29 y.o. male presenting with wrist pain. The history is provided by the patient.  Wrist Pain Pertinent negatives include no chest pain, no abdominal pain and no shortness of breath.   patient now a 6 day history of left wrist pain without any specific injury. The patient does work as a Education administrator. Started Thursday night and had swelling over the left wrist on Friday morning. Pain increases with movement of the thumb or wrist. No elbow or shoulder tenderness. No history of similar problem. No history of any traumatic injury.  Past Medical History  Diagnosis Date  . Depression   . Chronic abdominal pain   . Anxiety   . Tourette disease   . Hepatitis C   . Alcohol dependence   . Opiate dependence    Past Surgical History  Procedure Laterality Date  . Esophagogastroduodenoscopy (egd) with propofol Left 06/17/2014    Procedure: ESOPHAGOGASTRODUODENOSCOPY (EGD) WITH PROPOFOL;  Surgeon: Dorena Cookey, MD;  Location: [facility];  Service: Endoscopy;  Laterality: Left;   Family History  Problem Relation Age of Onset  . Ulcerative colitis Father    Social History  Substance Use Topics  . Smoking status: Current Every Day Smoker -- 1.00 packs/day    Types: Cigarettes  . Smokeless tobacco: None  . Alcohol Use: No     Comment: has stopped drinking x 2-3 months    Review of Systems  Constitutional: Negative for fever.  HENT: Negative for congestion.   Eyes: Negative for redness.  Respiratory: Negative for shortness of breath.   Cardiovascular: Negative for chest pain.  Gastrointestinal: Negative for abdominal pain.  Skin: Negative for rash and wound.  Neurological: Positive for numbness. Negative for  weakness.  Hematological: Does not bruise/bleed easily.  Psychiatric/Behavioral: Negative for confusion.      Allergies  Acetaminophen  Home Medications   Prior to Admission medications   Medication Sig Start Date End Date Taking? Authorizing Provider  ALPRAZolam Prudy Feeler) 1 MG tablet Take 1 tablet (1 mg total) by mouth at bedtime as needed for anxiety. Patient taking differently: Take 1 mg by mouth daily as needed for anxiety.  06/18/14   Shanker Levora Dredge, MD  Alum & Mag Hydroxide-Simeth (GI COCKTAIL) SUSP suspension Take 30 mLs by mouth 3 (three) times daily as needed for indigestion. Shake well. Patient not taking: Reported on 08/10/2014 08/07/14   Richarda Overlie, MD  alum & mag hydroxide-simeth (MAALOX PLUS) 400-400-40 MG/5ML suspension Take 30 mLs by mouth 3 (three) times daily as needed for indigestion.     Historical Provider, MD  naproxen (NAPROSYN) 500 MG tablet Take 1 tablet (500 mg total) by mouth 2 (two) times daily. 10/09/14   Vanetta Mulders, MD  nicotine (NICODERM CQ - DOSED IN MG/24 HOURS) 21 mg/24hr patch Place 1 patch (21 mg total) onto the skin daily. Patient not taking: Reported on 08/10/2014 08/07/14   Richarda Overlie, MD  ondansetron (ZOFRAN) 4 MG tablet Take 1 tablet (4 mg total) by mouth every 6 (six) hours. 08/07/14   Richarda Overlie, MD  pantoprazole (PROTONIX) 40 MG tablet Take 1 tablet (40 mg total) by mouth 2 (two) times daily. Switch for any other PPI at similar dose and frequency 08/07/14  Richarda Overlie, MD  promethazine (PHENERGAN) 25 MG suppository Place 1 suppository (25 mg total) rectally every 6 (six) hours as needed for nausea or vomiting. 08/10/14   Trixie Dredge, PA-C  sucralfate (CARAFATE) 1 GM/10ML suspension Take 10 mLs (1 g total) by mouth 4 (four) times daily -  with meals and at bedtime. 08/07/14   Richarda Overlie, MD  traZODone (DESYREL) 50 MG tablet Take 1 tablet (50 mg total) by mouth at bedtime. 08/07/14   Richarda Overlie, MD   BP 115/78 mmHg  Pulse 73  Temp(Src) 99.1 F (37.3  C) (Oral)  Resp 16  Ht  (1.753 m)  Wt 160 lb (72.576 kg)  BMI 23.62 kg/m2  SpO2 100% Physical Exam  Constitutional: He is oriented to person, place, and time. He appears well-developed and well-nourished. No distress.  HENT:  Head: Normocephalic and atraumatic.  Eyes: Conjunctivae and EOM are normal. Pupils are equal, round, and reactive to light.  Neck: Normal range of motion.  Cardiovascular: Normal rate, regular rhythm and normal heart sounds.   Pulmonary/Chest: Effort normal.  Abdominal: There is no tenderness.  Musculoskeletal: He exhibits edema and tenderness.  Left wrist with distal swelling over the radius side of the wrist. Minimal snuffbox tenderness. Increased pain with extension of the thumb. Cap refill is 1 second radial pulses 2+ sensation intact range of motion of fingers intact. No tenderness to elbow or shoulder. There is tenderness to palpation over the area of swelling.  Neurological: He is alert and oriented to person, place, and time. No cranial nerve deficit. He exhibits normal muscle tone. Coordination normal.  Skin: Skin is warm. No erythema.  Nursing note and vitals reviewed.   ED Course  Procedures (including critical care time) Labs Review Labs Reviewed - No data to display  Imaging Review Dg Wrist Complete Left  10/09/2014   CLINICAL DATA:  Left wrist pain for 5 days, no known injury  EXAM: LEFT WRIST - COMPLETE 3+ VIEW  COMPARISON:  The  FINDINGS: There is no evidence of fracture or dislocation. There is no evidence of arthropathy or other focal bone abnormality. Soft tissues are unremarkable.  IMPRESSION: Negative.   Electronically Signed   By: Natasha Mead M.D.   On: 10/09/2014 09:11   I have personally reviewed and evaluated these images and lab results as part of my medical decision-making.   EKG Interpretation None      MDM   Final diagnoses:  Wrist pain, acute, left    Wrist with a lot of swelling on the radial side mild snuffbox  tenderness but most of the swelling is proximal to the snuffbox. May very well be some sort of tendinitis but have not completely ruled out an occult navicular fracture. Patient will be treated with Velcro splint Naprosyn and referral to sports medicine for further evaluation. May very well be a [place].     Vanetta Mulders, MD 10/09/14 [phone]

## 2014-10-09 NOTE — Discharge Instructions (Signed)
X-rays are negative for any bony injury. But important to follow-up sports medicine referral information provided and make an appointment. Take the Naprosyn on a regular basis. Wear the splint until seen by sports medicine.

## 2015-04-02 DEATH — deceased

## 2017-05-24 IMAGING — CR DG WRIST COMPLETE 3+V*L*
4 series · 4 of 4 positions shown · non-contrast
Comparison: The

CLINICAL DATA: Left wrist pain for 5 days, no known injury

EXAM:
LEFT WRIST - COMPLETE 3+ VIEW

[x wrist pa left]
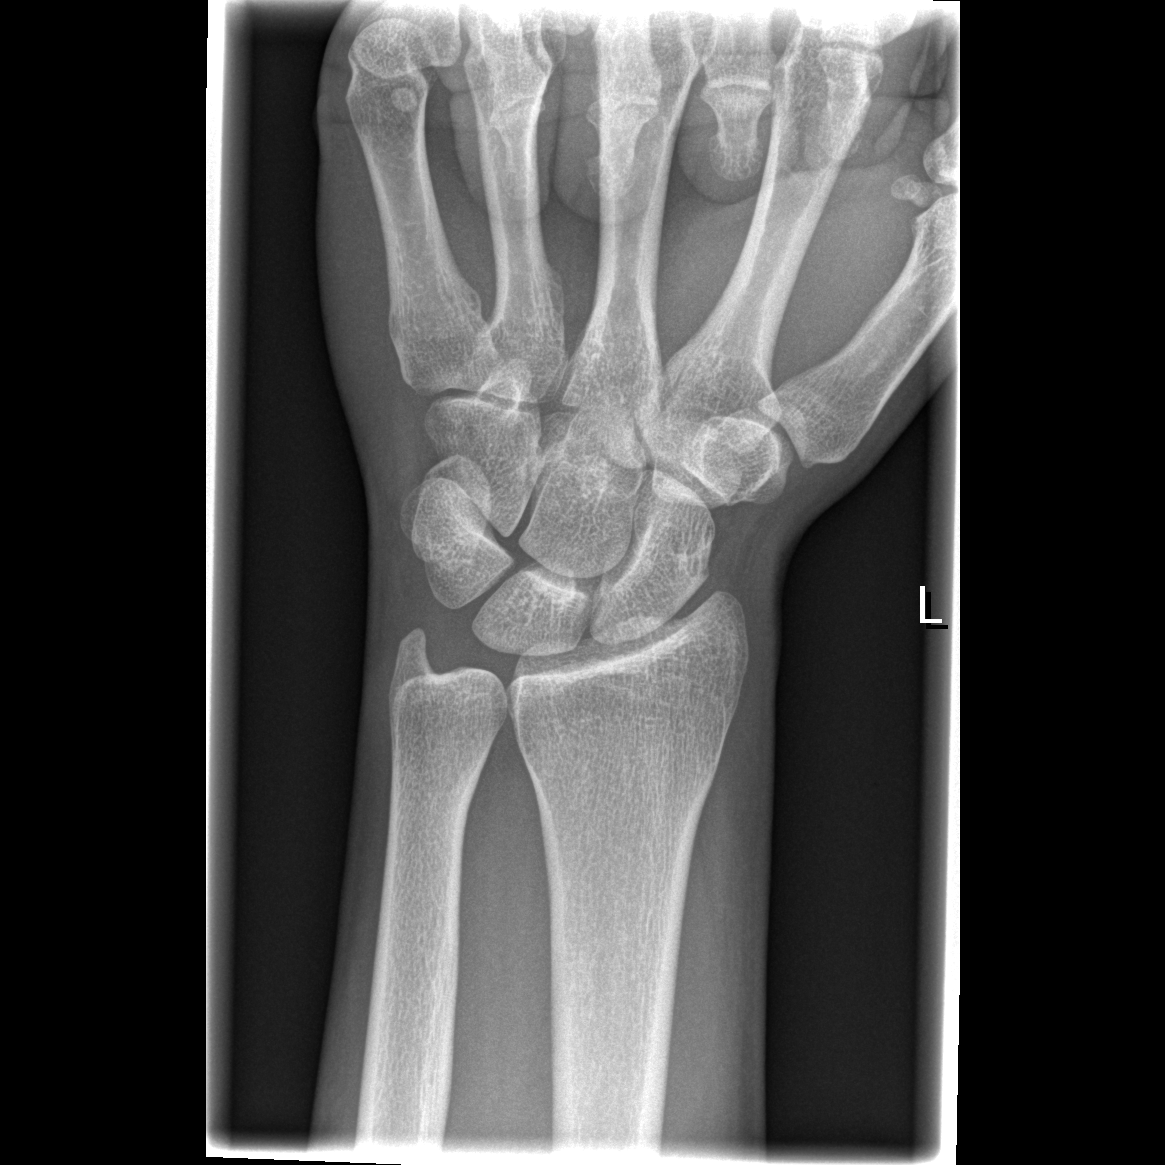

[x wrist obl left]
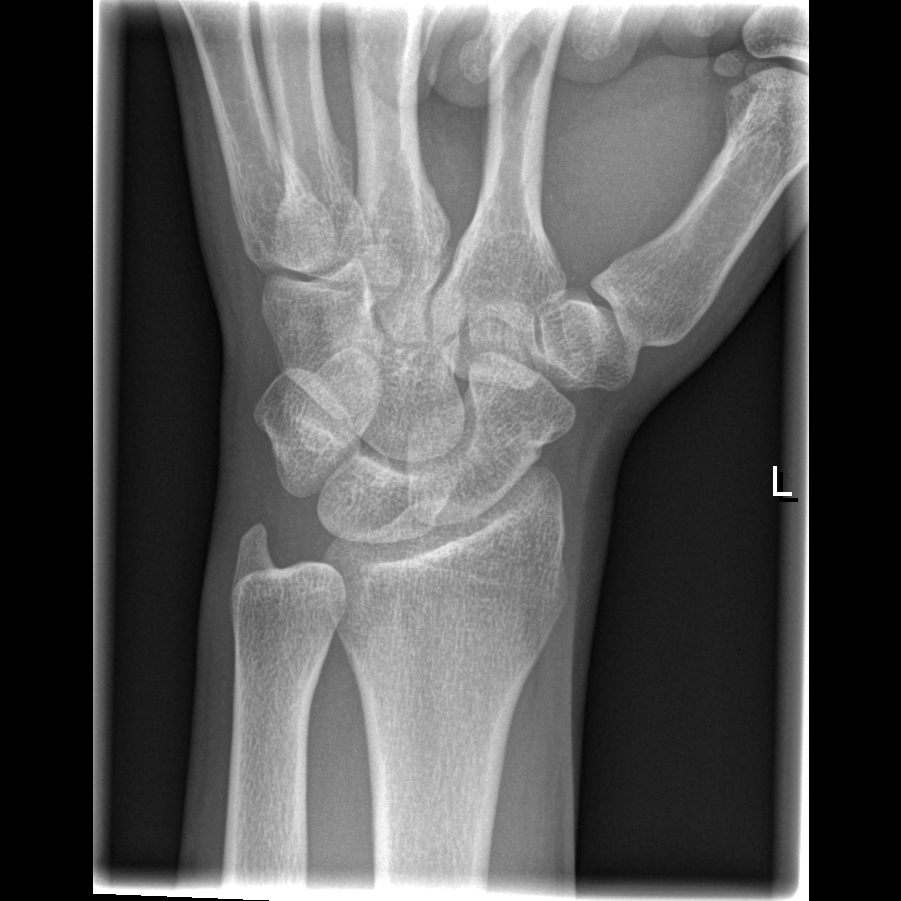

[x wrist lat left]
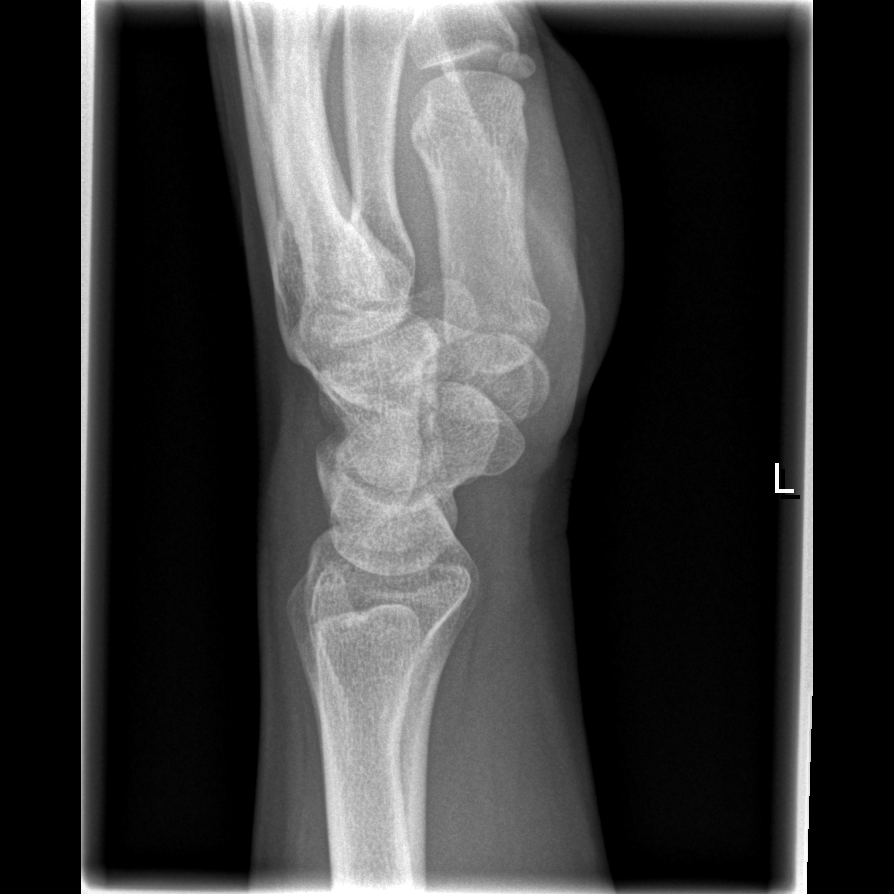

[x navicular]
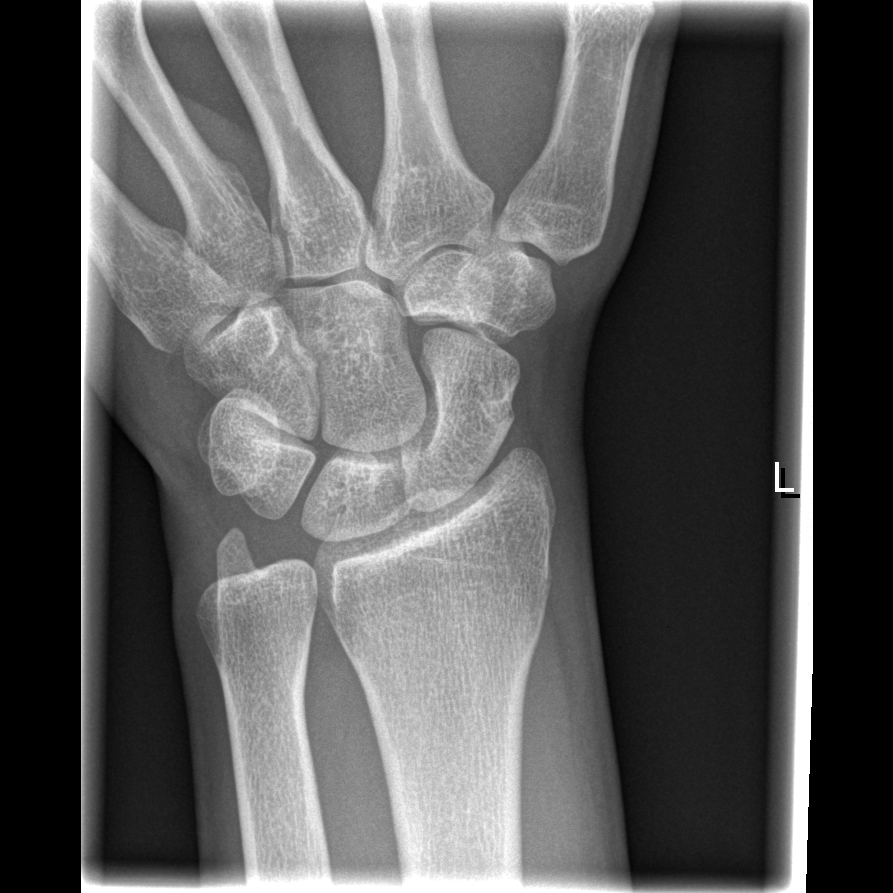

[4 of 4 positions shown; findings below may reference images not displayed]

FINDINGS: There is no evidence of fracture or dislocation. There is no
evidence of arthropathy or other focal bone abnormality. Soft
tissues are unremarkable.
IMPRESSION: Negative.
